# Patient Record
Sex: Female | Born: 1940 | Race: White | Hispanic: No | Marital: Married | Smoking: Former smoker
Health system: Southern US, Community
[De-identification: ages and names within clinical notes are randomized; demographics above are authoritative.]

## PROBLEM LIST (undated history)

## (undated) DIAGNOSIS — I10 Essential (primary) hypertension: Secondary | ICD-10-CM

## (undated) DIAGNOSIS — Z8489 Family history of other specified conditions: Secondary | ICD-10-CM

## (undated) DIAGNOSIS — Z972 Presence of dental prosthetic device (complete) (partial): Secondary | ICD-10-CM

## (undated) DIAGNOSIS — I209 Angina pectoris, unspecified: Secondary | ICD-10-CM

## (undated) DIAGNOSIS — I340 Nonrheumatic mitral (valve) insufficiency: Secondary | ICD-10-CM

## (undated) DIAGNOSIS — I5189 Other ill-defined heart diseases: Secondary | ICD-10-CM

## (undated) HISTORY — PX: ABDOMINAL HYSTERECTOMY: SHX81

---

## 2003-10-02 DIAGNOSIS — L409 Psoriasis, unspecified: Secondary | ICD-10-CM

## 2003-10-02 HISTORY — DX: Psoriasis, unspecified: L40.9

## 2003-10-02 HISTORY — PX: REPLACEMENT TOTAL KNEE BILATERAL: SUR1225

## 2004-08-21 ENCOUNTER — Ambulatory Visit: Payer: Self-pay | Admitting: Internal Medicine

## 2005-05-21 ENCOUNTER — Ambulatory Visit: Payer: Self-pay | Admitting: Gastroenterology

## 2006-03-07 ENCOUNTER — Ambulatory Visit: Payer: Self-pay | Admitting: Internal Medicine

## 2007-03-10 ENCOUNTER — Ambulatory Visit: Payer: Self-pay | Admitting: Internal Medicine

## 2008-06-03 ENCOUNTER — Ambulatory Visit: Payer: Self-pay | Admitting: Internal Medicine

## 2008-06-21 ENCOUNTER — Ambulatory Visit: Payer: Self-pay | Admitting: Unknown Physician Specialty

## 2008-12-28 ENCOUNTER — Ambulatory Visit: Payer: Self-pay | Admitting: Internal Medicine

## 2009-06-15 ENCOUNTER — Ambulatory Visit: Payer: Self-pay | Admitting: Internal Medicine

## 2010-06-16 ENCOUNTER — Ambulatory Visit: Payer: Self-pay | Admitting: Internal Medicine

## 2011-08-21 ENCOUNTER — Ambulatory Visit: Payer: Self-pay | Admitting: Internal Medicine

## 2012-09-15 ENCOUNTER — Ambulatory Visit: Payer: Self-pay | Admitting: Internal Medicine

## 2013-09-10 ENCOUNTER — Ambulatory Visit: Payer: Self-pay | Admitting: Gastroenterology

## 2013-09-16 ENCOUNTER — Ambulatory Visit: Payer: Self-pay | Admitting: Internal Medicine

## 2014-09-17 ENCOUNTER — Ambulatory Visit: Payer: Self-pay | Admitting: Internal Medicine

## 2014-10-06 DIAGNOSIS — R102 Pelvic and perineal pain: Secondary | ICD-10-CM | POA: Diagnosis not present

## 2014-12-14 DIAGNOSIS — E1159 Type 2 diabetes mellitus with other circulatory complications: Secondary | ICD-10-CM | POA: Diagnosis not present

## 2014-12-14 DIAGNOSIS — E039 Hypothyroidism, unspecified: Secondary | ICD-10-CM | POA: Diagnosis not present

## 2014-12-14 DIAGNOSIS — E782 Mixed hyperlipidemia: Secondary | ICD-10-CM | POA: Diagnosis not present

## 2014-12-21 DIAGNOSIS — I10 Essential (primary) hypertension: Secondary | ICD-10-CM | POA: Diagnosis not present

## 2014-12-21 DIAGNOSIS — E1159 Type 2 diabetes mellitus with other circulatory complications: Secondary | ICD-10-CM | POA: Diagnosis not present

## 2015-04-19 DIAGNOSIS — I10 Essential (primary) hypertension: Secondary | ICD-10-CM | POA: Diagnosis not present

## 2015-04-19 DIAGNOSIS — Z96653 Presence of artificial knee joint, bilateral: Secondary | ICD-10-CM | POA: Diagnosis not present

## 2015-04-19 DIAGNOSIS — E1151 Type 2 diabetes mellitus with diabetic peripheral angiopathy without gangrene: Secondary | ICD-10-CM | POA: Diagnosis not present

## 2015-04-26 DIAGNOSIS — E782 Mixed hyperlipidemia: Secondary | ICD-10-CM | POA: Diagnosis not present

## 2015-04-26 DIAGNOSIS — E1151 Type 2 diabetes mellitus with diabetic peripheral angiopathy without gangrene: Secondary | ICD-10-CM | POA: Diagnosis not present

## 2015-04-26 DIAGNOSIS — E039 Hypothyroidism, unspecified: Secondary | ICD-10-CM | POA: Diagnosis not present

## 2015-05-05 DIAGNOSIS — E119 Type 2 diabetes mellitus without complications: Secondary | ICD-10-CM | POA: Diagnosis not present

## 2015-09-20 DIAGNOSIS — E039 Hypothyroidism, unspecified: Secondary | ICD-10-CM | POA: Diagnosis not present

## 2015-09-20 DIAGNOSIS — E782 Mixed hyperlipidemia: Secondary | ICD-10-CM | POA: Diagnosis not present

## 2015-09-20 DIAGNOSIS — E1151 Type 2 diabetes mellitus with diabetic peripheral angiopathy without gangrene: Secondary | ICD-10-CM | POA: Diagnosis not present

## 2015-09-27 ENCOUNTER — Other Ambulatory Visit: Payer: Self-pay | Admitting: Internal Medicine

## 2015-09-27 DIAGNOSIS — E039 Hypothyroidism, unspecified: Secondary | ICD-10-CM | POA: Diagnosis not present

## 2015-09-27 DIAGNOSIS — Z1231 Encounter for screening mammogram for malignant neoplasm of breast: Secondary | ICD-10-CM

## 2015-09-27 DIAGNOSIS — E1151 Type 2 diabetes mellitus with diabetic peripheral angiopathy without gangrene: Secondary | ICD-10-CM | POA: Diagnosis not present

## 2015-09-27 DIAGNOSIS — Z0000 Encounter for general adult medical examination without abnormal findings: Secondary | ICD-10-CM | POA: Diagnosis not present

## 2015-09-29 ENCOUNTER — Ambulatory Visit
Admission: RE | Admit: 2015-09-29 | Discharge: 2015-09-29 | Disposition: A | Discharge status: Home / Self Care | Payer: Commercial Managed Care - HMO | Source: Ambulatory Visit | Attending: Internal Medicine | Admitting: Internal Medicine

## 2015-09-29 ENCOUNTER — Other Ambulatory Visit: Payer: Self-pay | Admitting: Internal Medicine

## 2015-09-29 DIAGNOSIS — Z1231 Encounter for screening mammogram for malignant neoplasm of breast: Secondary | ICD-10-CM | POA: Insufficient documentation

## 2016-08-17 ENCOUNTER — Other Ambulatory Visit: Payer: Self-pay | Admitting: Internal Medicine

## 2016-08-17 DIAGNOSIS — Z1231 Encounter for screening mammogram for malignant neoplasm of breast: Secondary | ICD-10-CM

## 2016-09-27 DIAGNOSIS — E1151 Type 2 diabetes mellitus with diabetic peripheral angiopathy without gangrene: Secondary | ICD-10-CM | POA: Diagnosis not present

## 2016-10-03 ENCOUNTER — Ambulatory Visit
Admission: RE | Admit: 2016-10-03 | Discharge: 2016-10-03 | Disposition: A | Discharge status: Home / Self Care | Payer: Commercial Managed Care - HMO | Source: Ambulatory Visit | Attending: Internal Medicine | Admitting: Internal Medicine

## 2016-10-03 DIAGNOSIS — Z1231 Encounter for screening mammogram for malignant neoplasm of breast: Secondary | ICD-10-CM | POA: Insufficient documentation

## 2016-10-04 DIAGNOSIS — E1151 Type 2 diabetes mellitus with diabetic peripheral angiopathy without gangrene: Secondary | ICD-10-CM | POA: Diagnosis not present

## 2016-10-04 DIAGNOSIS — Z0000 Encounter for general adult medical examination without abnormal findings: Secondary | ICD-10-CM | POA: Diagnosis not present

## 2016-11-12 DIAGNOSIS — R079 Chest pain, unspecified: Secondary | ICD-10-CM | POA: Diagnosis not present

## 2016-11-12 DIAGNOSIS — E860 Dehydration: Secondary | ICD-10-CM | POA: Diagnosis not present

## 2016-11-12 DIAGNOSIS — B349 Viral infection, unspecified: Secondary | ICD-10-CM | POA: Diagnosis not present

## 2017-03-28 DIAGNOSIS — E782 Mixed hyperlipidemia: Secondary | ICD-10-CM | POA: Diagnosis not present

## 2017-03-28 DIAGNOSIS — E1151 Type 2 diabetes mellitus with diabetic peripheral angiopathy without gangrene: Secondary | ICD-10-CM | POA: Diagnosis not present

## 2017-03-28 DIAGNOSIS — Z0000 Encounter for general adult medical examination without abnormal findings: Secondary | ICD-10-CM | POA: Diagnosis not present

## 2017-04-04 DIAGNOSIS — Z79899 Other long term (current) drug therapy: Secondary | ICD-10-CM | POA: Diagnosis not present

## 2017-04-04 DIAGNOSIS — Z0000 Encounter for general adult medical examination without abnormal findings: Secondary | ICD-10-CM | POA: Diagnosis not present

## 2017-04-04 DIAGNOSIS — E039 Hypothyroidism, unspecified: Secondary | ICD-10-CM | POA: Diagnosis not present

## 2017-04-04 DIAGNOSIS — E782 Mixed hyperlipidemia: Secondary | ICD-10-CM | POA: Diagnosis not present

## 2017-04-04 DIAGNOSIS — E1151 Type 2 diabetes mellitus with diabetic peripheral angiopathy without gangrene: Secondary | ICD-10-CM | POA: Diagnosis not present

## 2017-05-16 DIAGNOSIS — Z96652 Presence of left artificial knee joint: Secondary | ICD-10-CM | POA: Diagnosis not present

## 2017-05-16 DIAGNOSIS — M1711 Unilateral primary osteoarthritis, right knee: Secondary | ICD-10-CM | POA: Diagnosis not present

## 2017-05-16 DIAGNOSIS — M1712 Unilateral primary osteoarthritis, left knee: Secondary | ICD-10-CM | POA: Diagnosis not present

## 2017-05-16 DIAGNOSIS — Z96651 Presence of right artificial knee joint: Secondary | ICD-10-CM | POA: Diagnosis not present

## 2017-10-02 DIAGNOSIS — E782 Mixed hyperlipidemia: Secondary | ICD-10-CM | POA: Diagnosis not present

## 2017-10-02 DIAGNOSIS — E1151 Type 2 diabetes mellitus with diabetic peripheral angiopathy without gangrene: Secondary | ICD-10-CM | POA: Diagnosis not present

## 2017-10-02 DIAGNOSIS — E039 Hypothyroidism, unspecified: Secondary | ICD-10-CM | POA: Diagnosis not present

## 2017-10-09 DIAGNOSIS — E1151 Type 2 diabetes mellitus with diabetic peripheral angiopathy without gangrene: Secondary | ICD-10-CM | POA: Diagnosis not present

## 2017-10-09 DIAGNOSIS — Z0000 Encounter for general adult medical examination without abnormal findings: Secondary | ICD-10-CM | POA: Diagnosis not present

## 2017-10-09 DIAGNOSIS — E039 Hypothyroidism, unspecified: Secondary | ICD-10-CM | POA: Diagnosis not present

## 2017-11-12 ENCOUNTER — Other Ambulatory Visit: Payer: Self-pay | Admitting: Internal Medicine

## 2017-11-12 DIAGNOSIS — Z1231 Encounter for screening mammogram for malignant neoplasm of breast: Secondary | ICD-10-CM

## 2017-11-26 ENCOUNTER — Ambulatory Visit
Admission: RE | Admit: 2017-11-26 | Discharge: 2017-11-26 | Disposition: A | Discharge status: Home / Self Care | Payer: Medicare HMO | Source: Ambulatory Visit | Attending: Internal Medicine | Admitting: Internal Medicine

## 2017-11-26 DIAGNOSIS — Z1231 Encounter for screening mammogram for malignant neoplasm of breast: Secondary | ICD-10-CM | POA: Diagnosis not present

## 2018-01-31 DIAGNOSIS — E119 Type 2 diabetes mellitus without complications: Secondary | ICD-10-CM | POA: Diagnosis not present

## 2018-04-08 DIAGNOSIS — E1151 Type 2 diabetes mellitus with diabetic peripheral angiopathy without gangrene: Secondary | ICD-10-CM | POA: Diagnosis not present

## 2018-04-08 DIAGNOSIS — E039 Hypothyroidism, unspecified: Secondary | ICD-10-CM | POA: Diagnosis not present

## 2018-04-15 DIAGNOSIS — E039 Hypothyroidism, unspecified: Secondary | ICD-10-CM | POA: Diagnosis not present

## 2018-04-15 DIAGNOSIS — E782 Mixed hyperlipidemia: Secondary | ICD-10-CM | POA: Diagnosis not present

## 2018-04-15 DIAGNOSIS — E1151 Type 2 diabetes mellitus with diabetic peripheral angiopathy without gangrene: Secondary | ICD-10-CM | POA: Diagnosis not present

## 2018-06-23 DIAGNOSIS — Z111 Encounter for screening for respiratory tuberculosis: Secondary | ICD-10-CM | POA: Diagnosis not present

## 2018-06-26 DIAGNOSIS — Z23 Encounter for immunization: Secondary | ICD-10-CM | POA: Diagnosis not present

## 2018-10-08 DIAGNOSIS — E1151 Type 2 diabetes mellitus with diabetic peripheral angiopathy without gangrene: Secondary | ICD-10-CM | POA: Diagnosis not present

## 2018-10-08 DIAGNOSIS — E039 Hypothyroidism, unspecified: Secondary | ICD-10-CM | POA: Diagnosis not present

## 2018-10-08 DIAGNOSIS — E782 Mixed hyperlipidemia: Secondary | ICD-10-CM | POA: Diagnosis not present

## 2018-10-14 DIAGNOSIS — E039 Hypothyroidism, unspecified: Secondary | ICD-10-CM | POA: Diagnosis not present

## 2018-10-14 DIAGNOSIS — Z0000 Encounter for general adult medical examination without abnormal findings: Secondary | ICD-10-CM | POA: Diagnosis not present

## 2018-10-14 DIAGNOSIS — E1151 Type 2 diabetes mellitus with diabetic peripheral angiopathy without gangrene: Secondary | ICD-10-CM | POA: Diagnosis not present

## 2018-10-24 DIAGNOSIS — E039 Hypothyroidism, unspecified: Secondary | ICD-10-CM | POA: Diagnosis not present

## 2018-10-24 DIAGNOSIS — Z86010 Personal history of colonic polyps: Secondary | ICD-10-CM | POA: Diagnosis not present

## 2018-10-24 DIAGNOSIS — I10 Essential (primary) hypertension: Secondary | ICD-10-CM | POA: Diagnosis not present

## 2018-10-24 DIAGNOSIS — E782 Mixed hyperlipidemia: Secondary | ICD-10-CM | POA: Diagnosis not present

## 2018-10-24 DIAGNOSIS — E1151 Type 2 diabetes mellitus with diabetic peripheral angiopathy without gangrene: Secondary | ICD-10-CM | POA: Diagnosis not present

## 2018-11-24 DIAGNOSIS — K5730 Diverticulosis of large intestine without perforation or abscess without bleeding: Secondary | ICD-10-CM | POA: Diagnosis not present

## 2018-11-24 DIAGNOSIS — K640 First degree hemorrhoids: Secondary | ICD-10-CM | POA: Diagnosis not present

## 2018-11-24 DIAGNOSIS — Z86010 Personal history of colonic polyps: Secondary | ICD-10-CM | POA: Diagnosis not present

## 2019-03-09 DIAGNOSIS — H2511 Age-related nuclear cataract, right eye: Secondary | ICD-10-CM | POA: Diagnosis not present

## 2019-04-08 DIAGNOSIS — E1151 Type 2 diabetes mellitus with diabetic peripheral angiopathy without gangrene: Secondary | ICD-10-CM | POA: Diagnosis not present

## 2019-04-08 DIAGNOSIS — E039 Hypothyroidism, unspecified: Secondary | ICD-10-CM | POA: Diagnosis not present

## 2019-04-15 DIAGNOSIS — E1165 Type 2 diabetes mellitus with hyperglycemia: Secondary | ICD-10-CM | POA: Diagnosis not present

## 2019-07-09 DIAGNOSIS — E1165 Type 2 diabetes mellitus with hyperglycemia: Secondary | ICD-10-CM | POA: Diagnosis not present

## 2019-07-16 DIAGNOSIS — E1165 Type 2 diabetes mellitus with hyperglycemia: Secondary | ICD-10-CM | POA: Diagnosis not present

## 2019-07-16 DIAGNOSIS — E785 Hyperlipidemia, unspecified: Secondary | ICD-10-CM | POA: Diagnosis not present

## 2019-07-16 DIAGNOSIS — I10 Essential (primary) hypertension: Secondary | ICD-10-CM | POA: Diagnosis not present

## 2019-07-16 DIAGNOSIS — Z87891 Personal history of nicotine dependence: Secondary | ICD-10-CM | POA: Diagnosis not present

## 2019-10-22 DIAGNOSIS — E1151 Type 2 diabetes mellitus with diabetic peripheral angiopathy without gangrene: Secondary | ICD-10-CM | POA: Diagnosis not present

## 2019-10-30 DIAGNOSIS — E119 Type 2 diabetes mellitus without complications: Secondary | ICD-10-CM | POA: Diagnosis not present

## 2019-10-30 DIAGNOSIS — Z0000 Encounter for general adult medical examination without abnormal findings: Secondary | ICD-10-CM | POA: Diagnosis not present

## 2019-10-30 DIAGNOSIS — Z87891 Personal history of nicotine dependence: Secondary | ICD-10-CM | POA: Diagnosis not present

## 2019-10-30 DIAGNOSIS — I10 Essential (primary) hypertension: Secondary | ICD-10-CM | POA: Diagnosis not present

## 2019-10-30 DIAGNOSIS — E785 Hyperlipidemia, unspecified: Secondary | ICD-10-CM | POA: Diagnosis not present

## 2020-01-28 DIAGNOSIS — E1165 Type 2 diabetes mellitus with hyperglycemia: Secondary | ICD-10-CM | POA: Diagnosis not present

## 2020-01-29 DIAGNOSIS — Z87891 Personal history of nicotine dependence: Secondary | ICD-10-CM | POA: Diagnosis not present

## 2020-01-29 DIAGNOSIS — Z789 Other specified health status: Secondary | ICD-10-CM | POA: Diagnosis not present

## 2020-01-29 DIAGNOSIS — E119 Type 2 diabetes mellitus without complications: Secondary | ICD-10-CM | POA: Diagnosis not present

## 2020-01-29 DIAGNOSIS — I10 Essential (primary) hypertension: Secondary | ICD-10-CM | POA: Diagnosis not present

## 2020-01-29 DIAGNOSIS — Z7984 Long term (current) use of oral hypoglycemic drugs: Secondary | ICD-10-CM | POA: Diagnosis not present

## 2020-02-01 DIAGNOSIS — Z03818 Encounter for observation for suspected exposure to other biological agents ruled out: Secondary | ICD-10-CM | POA: Diagnosis not present

## 2020-03-11 DIAGNOSIS — E119 Type 2 diabetes mellitus without complications: Secondary | ICD-10-CM | POA: Diagnosis not present

## 2020-04-07 DIAGNOSIS — M25562 Pain in left knee: Secondary | ICD-10-CM | POA: Diagnosis not present

## 2020-04-07 DIAGNOSIS — Z96653 Presence of artificial knee joint, bilateral: Secondary | ICD-10-CM | POA: Diagnosis not present

## 2020-04-07 DIAGNOSIS — M25561 Pain in right knee: Secondary | ICD-10-CM | POA: Diagnosis not present

## 2020-04-18 ENCOUNTER — Other Ambulatory Visit: Payer: Self-pay | Admitting: Internal Medicine

## 2020-04-18 DIAGNOSIS — Z1231 Encounter for screening mammogram for malignant neoplasm of breast: Secondary | ICD-10-CM

## 2020-04-19 ENCOUNTER — Ambulatory Visit
Admission: RE | Admit: 2020-04-19 | Discharge: 2020-04-19 | Disposition: A | Discharge status: Home / Self Care | Payer: Medicare HMO | Source: Ambulatory Visit | Attending: Internal Medicine | Admitting: Internal Medicine

## 2020-04-19 DIAGNOSIS — Z1231 Encounter for screening mammogram for malignant neoplasm of breast: Secondary | ICD-10-CM

## 2020-04-21 DIAGNOSIS — E1151 Type 2 diabetes mellitus with diabetic peripheral angiopathy without gangrene: Secondary | ICD-10-CM | POA: Diagnosis not present

## 2020-04-21 DIAGNOSIS — E1165 Type 2 diabetes mellitus with hyperglycemia: Secondary | ICD-10-CM | POA: Diagnosis not present

## 2020-04-28 DIAGNOSIS — E119 Type 2 diabetes mellitus without complications: Secondary | ICD-10-CM | POA: Diagnosis not present

## 2020-08-01 ENCOUNTER — Other Ambulatory Visit: Payer: Self-pay | Admitting: Cardiology

## 2020-08-01 ENCOUNTER — Other Ambulatory Visit: Payer: Self-pay | Admitting: Gastroenterology

## 2020-08-01 DIAGNOSIS — R079 Chest pain, unspecified: Secondary | ICD-10-CM

## 2020-08-01 DIAGNOSIS — R0602 Shortness of breath: Secondary | ICD-10-CM

## 2020-08-01 DIAGNOSIS — I10 Essential (primary) hypertension: Secondary | ICD-10-CM | POA: Diagnosis not present

## 2020-08-01 DIAGNOSIS — E119 Type 2 diabetes mellitus without complications: Secondary | ICD-10-CM | POA: Diagnosis not present

## 2020-08-01 DIAGNOSIS — R0600 Dyspnea, unspecified: Secondary | ICD-10-CM | POA: Diagnosis not present

## 2020-08-01 DIAGNOSIS — I209 Angina pectoris, unspecified: Secondary | ICD-10-CM | POA: Diagnosis not present

## 2020-08-01 DIAGNOSIS — E782 Mixed hyperlipidemia: Secondary | ICD-10-CM | POA: Diagnosis not present

## 2020-08-01 DIAGNOSIS — R0789 Other chest pain: Secondary | ICD-10-CM | POA: Diagnosis not present

## 2020-08-02 ENCOUNTER — Other Ambulatory Visit: Payer: Self-pay | Admitting: Physician Assistant

## 2020-08-02 DIAGNOSIS — I10 Essential (primary) hypertension: Secondary | ICD-10-CM

## 2020-08-02 DIAGNOSIS — E785 Hyperlipidemia, unspecified: Secondary | ICD-10-CM

## 2020-08-02 DIAGNOSIS — R0602 Shortness of breath: Secondary | ICD-10-CM

## 2020-08-12 ENCOUNTER — Ambulatory Visit
Admission: RE | Admit: 2020-08-12 | Discharge: 2020-08-12 | Disposition: A | Discharge status: Home / Self Care | Payer: Medicare HMO | Source: Ambulatory Visit | Attending: Physician Assistant | Admitting: Physician Assistant

## 2020-08-12 ENCOUNTER — Encounter
Admission: RE | Admit: 2020-08-12 | Discharge: 2020-08-12 | Disposition: A | Discharge status: Home / Self Care | Payer: Medicare HMO | Source: Ambulatory Visit | Attending: Cardiology | Admitting: Cardiology

## 2020-08-12 ENCOUNTER — Other Ambulatory Visit: Payer: Self-pay

## 2020-08-12 DIAGNOSIS — R0602 Shortness of breath: Secondary | ICD-10-CM | POA: Insufficient documentation

## 2020-08-12 DIAGNOSIS — R0789 Other chest pain: Secondary | ICD-10-CM | POA: Diagnosis not present

## 2020-08-12 DIAGNOSIS — R079 Chest pain, unspecified: Secondary | ICD-10-CM

## 2020-08-12 DIAGNOSIS — E785 Hyperlipidemia, unspecified: Secondary | ICD-10-CM | POA: Insufficient documentation

## 2020-08-12 DIAGNOSIS — I10 Essential (primary) hypertension: Secondary | ICD-10-CM | POA: Insufficient documentation

## 2020-08-12 DIAGNOSIS — R0600 Dyspnea, unspecified: Secondary | ICD-10-CM | POA: Diagnosis not present

## 2020-08-12 MED ORDER — REGADENOSON 0.4 MG/5ML IV SOLN
0.4000 mg | Freq: Once | INTRAVENOUS | Status: AC
  Administered 2020-08-12: 0.4 mg via INTRAVENOUS

## 2020-08-12 MED ORDER — TECHNETIUM TC 99M TETROFOSMIN IV KIT
32.0300 | PACK | Freq: Once | INTRAVENOUS | Status: AC | PRN
  Administered 2020-08-12: 32.03 via INTRAVENOUS

## 2020-08-12 MED ORDER — TECHNETIUM TC 99M TETROFOSMIN IV KIT
10.0000 | PACK | Freq: Once | INTRAVENOUS | Status: AC | PRN
  Administered 2020-08-12: 10.04 via INTRAVENOUS

## 2020-08-12 NOTE — Progress Notes (Signed)
*  PRELIMINARY RESULTS* Echocardiogram 2D Echocardiogram has been performed.  Cristela Blue 08/12/2020, 12:06 PM

## 2020-08-13 LAB — NM MYOCAR MULTI W/SPECT W/WALL MOTION / EF
Estimated workload: 1 METS
Exercise duration (sec): 53 s
LV dias vol: 45 mL (ref 46–106)
LV sys vol: 14 mL
MPHR: 143 {beats}/min
Peak HR: 107 {beats}/min
Percent HR: 74 %
Rest HR: 65 {beats}/min
SDS: 16
SRS: 0
SSS: 13
TID: 1.1

## 2020-08-13 LAB — ECHOCARDIOGRAM COMPLETE
AR max vel: 2.33 cm2
AV Area VTI: 2.52 cm2
AV Area mean vel: 2.23 cm2
AV Mean grad: 5 mmHg
AV Peak grad: 8.2 mmHg
Ao pk vel: 1.43 m/s
Area-P 1/2: 3.12 cm2
S' Lateral: 2.18 cm

## 2020-08-17 DIAGNOSIS — Z8249 Family history of ischemic heart disease and other diseases of the circulatory system: Secondary | ICD-10-CM | POA: Diagnosis not present

## 2020-08-17 DIAGNOSIS — R0602 Shortness of breath: Secondary | ICD-10-CM | POA: Diagnosis not present

## 2020-08-17 DIAGNOSIS — E1151 Type 2 diabetes mellitus with diabetic peripheral angiopathy without gangrene: Secondary | ICD-10-CM | POA: Diagnosis not present

## 2020-08-17 DIAGNOSIS — R6889 Other general symptoms and signs: Secondary | ICD-10-CM | POA: Diagnosis not present

## 2020-08-17 DIAGNOSIS — Z01818 Encounter for other preprocedural examination: Secondary | ICD-10-CM | POA: Diagnosis not present

## 2020-08-17 DIAGNOSIS — E782 Mixed hyperlipidemia: Secondary | ICD-10-CM | POA: Diagnosis not present

## 2020-08-17 DIAGNOSIS — I10 Essential (primary) hypertension: Secondary | ICD-10-CM | POA: Diagnosis not present

## 2020-08-17 DIAGNOSIS — R079 Chest pain, unspecified: Secondary | ICD-10-CM | POA: Diagnosis not present

## 2020-08-17 DIAGNOSIS — R9439 Abnormal result of other cardiovascular function study: Secondary | ICD-10-CM | POA: Diagnosis not present

## 2020-08-30 ENCOUNTER — Other Ambulatory Visit
Admission: RE | Admit: 2020-08-30 | Discharge: 2020-08-30 | Disposition: A | Discharge status: Home / Self Care | Payer: Medicare HMO | Source: Ambulatory Visit | Attending: Physician Assistant | Admitting: Physician Assistant

## 2020-08-30 ENCOUNTER — Other Ambulatory Visit: Payer: Self-pay

## 2020-08-30 DIAGNOSIS — Z01812 Encounter for preprocedural laboratory examination: Secondary | ICD-10-CM | POA: Diagnosis not present

## 2020-08-30 DIAGNOSIS — Z20822 Contact with and (suspected) exposure to covid-19: Secondary | ICD-10-CM | POA: Insufficient documentation

## 2020-08-30 LAB — SARS CORONAVIRUS 2 (TAT 6-24 HRS): SARS Coronavirus 2: NEGATIVE

## 2020-08-31 ENCOUNTER — Encounter: Payer: Self-pay | Admitting: Cardiology

## 2020-08-31 ENCOUNTER — Other Ambulatory Visit: Payer: Self-pay

## 2020-08-31 ENCOUNTER — Encounter
Admission: RE | Disposition: A | Discharge status: Home / Self Care | Payer: Self-pay | Source: Home / Self Care | Attending: Cardiology

## 2020-08-31 ENCOUNTER — Observation Stay
Admission: RE | Admit: 2020-08-31 | Discharge: 2020-09-01 | Disposition: A | Discharge status: Home / Self Care | Payer: Medicare HMO | Attending: Cardiology | Admitting: Cardiology

## 2020-08-31 DIAGNOSIS — I2510 Atherosclerotic heart disease of native coronary artery without angina pectoris: Secondary | ICD-10-CM | POA: Diagnosis present

## 2020-08-31 DIAGNOSIS — R0602 Shortness of breath: Secondary | ICD-10-CM

## 2020-08-31 DIAGNOSIS — R9439 Abnormal result of other cardiovascular function study: Secondary | ICD-10-CM | POA: Insufficient documentation

## 2020-08-31 DIAGNOSIS — R079 Chest pain, unspecified: Secondary | ICD-10-CM | POA: Diagnosis not present

## 2020-08-31 DIAGNOSIS — I25119 Atherosclerotic heart disease of native coronary artery with unspecified angina pectoris: Secondary | ICD-10-CM | POA: Diagnosis not present

## 2020-08-31 DIAGNOSIS — Z9861 Coronary angioplasty status: Secondary | ICD-10-CM | POA: Diagnosis not present

## 2020-08-31 DIAGNOSIS — R9430 Abnormal result of cardiovascular function study, unspecified: Secondary | ICD-10-CM

## 2020-08-31 HISTORY — PX: LEFT HEART CATH AND CORONARY ANGIOGRAPHY: CATH118249

## 2020-08-31 HISTORY — PX: CORONARY STENT INTERVENTION: CATH118234

## 2020-08-31 HISTORY — DX: Essential (primary) hypertension: I10

## 2020-08-31 HISTORY — DX: Angina pectoris, unspecified: I20.9

## 2020-08-31 LAB — GLUCOSE, CAPILLARY
Glucose-Capillary: 166 mg/dL — ABNORMAL HIGH (ref 70–99)
Glucose-Capillary: 157 mg/dL — ABNORMAL HIGH (ref 70–99)
Glucose-Capillary: 180 mg/dL — ABNORMAL HIGH (ref 70–99)
Glucose-Capillary: 166 mg/dL — ABNORMAL HIGH (ref 70–99)

## 2020-08-31 LAB — HEMOGLOBIN A1C
Hgb A1c MFr Bld: 8 % — ABNORMAL HIGH (ref 4.8–5.6)
Mean Plasma Glucose: 182.9 mg/dL

## 2020-08-31 SURGERY — LEFT HEART CATH AND CORONARY ANGIOGRAPHY
Anesthesia: Moderate Sedation

## 2020-08-31 MED ORDER — HEPARIN SODIUM (PORCINE) 1000 UNIT/ML IJ SOLN
INTRAMUSCULAR | Status: DC | PRN
  Administered 2020-08-31: 7000 [IU] via INTRAVENOUS
  Administered 2020-08-31: 3500 [IU] via INTRAVENOUS

## 2020-08-31 MED ORDER — SODIUM CHLORIDE 0.9 % WEIGHT BASED INFUSION: 1.0000 mL/kg/h | INTRAVENOUS | Status: DC

## 2020-08-31 MED ORDER — FENTANYL CITRATE (PF) 100 MCG/2ML IJ SOLN
INTRAMUSCULAR | Status: DC | PRN
  Administered 2020-08-31 (×2): 25 ug via INTRAVENOUS

## 2020-08-31 MED ORDER — LEVOTHYROXINE SODIUM 100 MCG PO TABS
100.0000 ug | ORAL_TABLET | Freq: Every day | ORAL | Status: DC
  Administered 2020-09-01: 100 ug via ORAL
  Filled 2020-08-31: qty 1

## 2020-08-31 MED ORDER — SODIUM CHLORIDE 0.9% FLUSH: 3.0000 mL | INTRAVENOUS | Status: DC | PRN

## 2020-08-31 MED ORDER — SODIUM CHLORIDE 0.9 % WEIGHT BASED INFUSION
1.0000 mL/kg/h | INTRAVENOUS | Status: AC
  Administered 2020-08-31: 1 mL/kg/h via INTRAVENOUS

## 2020-08-31 MED ORDER — LIDOCAINE HCL (PF) 1 % IJ SOLN
INTRAMUSCULAR | Status: AC
  Filled 2020-08-31: qty 30

## 2020-08-31 MED ORDER — CLOPIDOGREL BISULFATE 300 MG PO TABS
ORAL_TABLET | ORAL | Status: AC
  Filled 2020-08-31: qty 2

## 2020-08-31 MED ORDER — GLIMEPIRIDE 2 MG PO TABS
2.0000 mg | ORAL_TABLET | Freq: Every day | ORAL | Status: DC
  Administered 2020-09-01: 2 mg via ORAL
  Filled 2020-08-31: qty 1

## 2020-08-31 MED ORDER — HEPARIN (PORCINE) IN NACL 1000-0.9 UT/500ML-% IV SOLN
INTRAVENOUS | Status: AC
  Filled 2020-08-31: qty 1000

## 2020-08-31 MED ORDER — ASPIRIN 81 MG PO CHEW
81.0000 mg | CHEWABLE_TABLET | Freq: Every day | ORAL | Status: DC
  Administered 2020-09-01: 81 mg via ORAL
  Filled 2020-08-31: qty 1

## 2020-08-31 MED ORDER — CLOPIDOGREL BISULFATE 75 MG PO TABS
75.0000 mg | ORAL_TABLET | Freq: Every day | ORAL | Status: DC
  Administered 2020-09-01: 75 mg via ORAL
  Filled 2020-08-31: qty 1

## 2020-08-31 MED ORDER — INSULIN ASPART 100 UNIT/ML ~~LOC~~ SOLN
0.0000 [IU] | Freq: Three times a day (TID) | SUBCUTANEOUS | Status: DC
  Administered 2020-08-31: 3 [IU] via SUBCUTANEOUS
  Filled 2020-08-31 (×2): qty 1

## 2020-08-31 MED ORDER — TRIAMTERENE-HCTZ 37.5-25 MG PO TABS
1.0000 | ORAL_TABLET | Freq: Every day | ORAL | Status: DC
  Administered 2020-08-31 – 2020-09-01 (×2): 1 via ORAL
  Filled 2020-08-31 (×2): qty 1

## 2020-08-31 MED ORDER — FENTANYL CITRATE (PF) 100 MCG/2ML IJ SOLN
INTRAMUSCULAR | Status: AC
  Filled 2020-08-31: qty 2

## 2020-08-31 MED ORDER — AMLODIPINE BESYLATE 5 MG PO TABS
2.5000 mg | ORAL_TABLET | Freq: Every day | ORAL | Status: DC
  Administered 2020-08-31 – 2020-09-01 (×2): 2.5 mg via ORAL
  Filled 2020-08-31 (×2): qty 1

## 2020-08-31 MED ORDER — ASPIRIN 81 MG PO CHEW
CHEWABLE_TABLET | ORAL | Status: DC | PRN
  Administered 2020-08-31: 243 mg via ORAL

## 2020-08-31 MED ORDER — HEPARIN (PORCINE) IN NACL 1000-0.9 UT/500ML-% IV SOLN
INTRAVENOUS | Status: DC | PRN
  Administered 2020-08-31: 500 mL

## 2020-08-31 MED ORDER — HEPARIN SODIUM (PORCINE) 1000 UNIT/ML IJ SOLN
INTRAMUSCULAR | Status: AC
  Filled 2020-08-31: qty 1

## 2020-08-31 MED ORDER — HYDRALAZINE HCL 20 MG/ML IJ SOLN: 10.0000 mg | INTRAMUSCULAR | Status: AC | PRN

## 2020-08-31 MED ORDER — VERAPAMIL HCL 2.5 MG/ML IV SOLN
INTRAVENOUS | Status: AC
  Filled 2020-08-31: qty 2

## 2020-08-31 MED ORDER — CLOPIDOGREL BISULFATE 75 MG PO TABS
ORAL_TABLET | ORAL | Status: DC | PRN
  Administered 2020-08-31: 600 mg via ORAL

## 2020-08-31 MED ORDER — VERAPAMIL HCL 2.5 MG/ML IV SOLN
INTRAVENOUS | Status: DC | PRN
  Administered 2020-08-31: 2.5 mg via INTRA_ARTERIAL

## 2020-08-31 MED ORDER — LABETALOL HCL 5 MG/ML IV SOLN: 10.0000 mg | INTRAVENOUS | Status: AC | PRN

## 2020-08-31 MED ORDER — MIDAZOLAM HCL 2 MG/2ML IJ SOLN
INTRAMUSCULAR | Status: AC
  Filled 2020-08-31: qty 2

## 2020-08-31 MED ORDER — SODIUM CHLORIDE 0.9 % IV SOLN: 250.0000 mL | INTRAVENOUS | Status: DC | PRN

## 2020-08-31 MED ORDER — MIDAZOLAM HCL 2 MG/2ML IJ SOLN
INTRAMUSCULAR | Status: DC | PRN
  Administered 2020-08-31: 1 mg via INTRAVENOUS
  Administered 2020-08-31: 0.5 mg via INTRAVENOUS

## 2020-08-31 MED ORDER — IOHEXOL 300 MG/ML  SOLN
INTRAMUSCULAR | Status: DC | PRN
  Administered 2020-08-31: 200 mL

## 2020-08-31 MED ORDER — SODIUM CHLORIDE 0.9% FLUSH
3.0000 mL | Freq: Two times a day (BID) | INTRAVENOUS | Status: DC
  Administered 2020-08-31 – 2020-09-01 (×2): 3 mL via INTRAVENOUS

## 2020-08-31 MED ORDER — ASPIRIN 81 MG PO CHEW
CHEWABLE_TABLET | ORAL | Status: AC
  Filled 2020-08-31: qty 1

## 2020-08-31 MED ORDER — LIDOCAINE HCL (PF) 1 % IJ SOLN
INTRAMUSCULAR | Status: DC | PRN
  Administered 2020-08-31: 2 mL

## 2020-08-31 MED ORDER — SODIUM CHLORIDE 0.9% FLUSH
3.0000 mL | Freq: Two times a day (BID) | INTRAVENOUS | Status: DC
  Administered 2020-09-01: 3 mL via INTRAVENOUS

## 2020-08-31 MED ORDER — ACETAMINOPHEN 325 MG PO TABS: 650.0000 mg | ORAL_TABLET | ORAL | Status: DC | PRN

## 2020-08-31 MED ORDER — NITROGLYCERIN 1 MG/10 ML FOR IR/CATH LAB
INTRA_ARTERIAL | Status: DC | PRN
  Administered 2020-08-31 (×2): 200 ug via INTRACORONARY

## 2020-08-31 MED ORDER — ASPIRIN 81 MG PO CHEW
81.0000 mg | CHEWABLE_TABLET | ORAL | Status: AC
  Administered 2020-08-31: 81 mg via ORAL

## 2020-08-31 MED ORDER — SIMVASTATIN 20 MG PO TABS
20.0000 mg | ORAL_TABLET | Freq: Every day | ORAL | Status: DC
  Administered 2020-08-31: 20 mg via ORAL
  Filled 2020-08-31: qty 1

## 2020-08-31 MED ORDER — SODIUM CHLORIDE 0.9 % WEIGHT BASED INFUSION: 231.0000 mL/h | INTRAVENOUS | Status: AC

## 2020-08-31 MED ORDER — ONDANSETRON HCL 4 MG/2ML IJ SOLN: 4.0000 mg | Freq: Four times a day (QID) | INTRAMUSCULAR | Status: DC | PRN

## 2020-08-31 MED ORDER — ASPIRIN 81 MG PO CHEW
CHEWABLE_TABLET | ORAL | Status: AC
  Filled 2020-08-31: qty 3

## 2020-08-31 SURGICAL SUPPLY — 16 items
BALLN TREK RX 2.5X20 (BALLOONS) ×4
BALLN ~~LOC~~ EUPHORA RX 3.25X15 (BALLOONS) ×4
BALLOON TREK RX 2.5X20 (BALLOONS) ×2 IMPLANT
BALLOON ~~LOC~~ EUPHORA RX 3.25X15 (BALLOONS) ×2 IMPLANT
CATH 5F 110X4 TIG (CATHETERS) ×4 IMPLANT
CATH LAUNCHER 6FR JL3.5 (CATHETERS) ×4 IMPLANT
CATH VISTA GUIDE 6FR XB3.5 (CATHETERS) ×4 IMPLANT
DEVICE RAD TR BAND REGULAR (VASCULAR PRODUCTS) ×4 IMPLANT
GLIDESHEATH SLEND SS 6F .021 (SHEATH) ×4 IMPLANT
GUIDEWIRE INQWIRE 1.5J.035X260 (WIRE) ×2 IMPLANT
INQWIRE 1.5J .035X260CM (WIRE) ×4
KIT ENCORE 26 ADVANTAGE (KITS) ×4 IMPLANT
KIT MANI 3VAL PERCEP (MISCELLANEOUS) ×4 IMPLANT
PACK CARDIAC CATH (CUSTOM PROCEDURE TRAY) ×4 IMPLANT
STENT RESOLUTE ONYX 3.0X22 (Permanent Stent) ×4 IMPLANT
WIRE ASAHI PROWATER 180CM (WIRE) ×4 IMPLANT

## 2020-09-01 ENCOUNTER — Encounter: Payer: Self-pay | Admitting: Cardiology

## 2020-09-01 DIAGNOSIS — R9439 Abnormal result of other cardiovascular function study: Secondary | ICD-10-CM | POA: Diagnosis not present

## 2020-09-01 DIAGNOSIS — R079 Chest pain, unspecified: Secondary | ICD-10-CM | POA: Diagnosis not present

## 2020-09-01 DIAGNOSIS — I25119 Atherosclerotic heart disease of native coronary artery with unspecified angina pectoris: Secondary | ICD-10-CM | POA: Diagnosis not present

## 2020-09-01 DIAGNOSIS — I2510 Atherosclerotic heart disease of native coronary artery without angina pectoris: Secondary | ICD-10-CM | POA: Diagnosis not present

## 2020-09-01 DIAGNOSIS — Z9861 Coronary angioplasty status: Secondary | ICD-10-CM | POA: Diagnosis not present

## 2020-09-01 LAB — BASIC METABOLIC PANEL
Anion gap: 8 (ref 5–15)
BUN: 21 mg/dL (ref 8–23)
CO2: 25 mmol/L (ref 22–32)
Calcium: 9.1 mg/dL (ref 8.9–10.3)
Chloride: 102 mmol/L (ref 98–111)
Creatinine, Ser: 0.86 mg/dL (ref 0.44–1.00)
GFR, Estimated: >60 mL/min (ref >60)
Glucose, Bld: 168 mg/dL — ABNORMAL HIGH (ref 70–99)
Potassium: 3.5 mmol/L (ref 3.5–5.1)
Sodium: 135 mmol/L (ref 135–145)

## 2020-09-01 LAB — GLUCOSE, CAPILLARY
Glucose-Capillary: 198 mg/dL — ABNORMAL HIGH (ref 70–99)
Glucose-Capillary: 193 mg/dL — ABNORMAL HIGH (ref 70–99)

## 2020-09-01 LAB — CBC
HCT: 43.1 % (ref 36.0–46.0)
Hemoglobin: 14.6 g/dL (ref 12.0–15.0)
MCH: 28.6 pg (ref 26.0–34.0)
MCHC: 33.9 g/dL (ref 30.0–36.0)
MCV: 84.5 fL (ref 80.0–100.0)
Platelets: 236 10*3/uL (ref 150–400)
RBC: 5.1 MIL/uL (ref 3.87–5.11)
RDW: 13.2 % (ref 11.5–15.5)
WBC: 6.9 10*3/uL (ref 4.0–10.5)
nRBC: 0 % (ref 0.0–0.2)

## 2020-09-01 LAB — POCT ACTIVATED CLOTTING TIME
Activated Clotting Time: 273 seconds
Activated Clotting Time: 333 seconds

## 2020-09-01 MED ORDER — CLOPIDOGREL BISULFATE 75 MG PO TABS: 75.0000 mg | ORAL_TABLET | Freq: Every day | ORAL | Status: DC

## 2020-09-01 MED ORDER — ASPIRIN 81 MG PO CHEW: 81.0000 mg | CHEWABLE_TABLET | Freq: Every day | ORAL | 11 refills | Status: DC

## 2020-09-01 NOTE — Discharge Summary (Signed)
Physician Discharge Summary      Patient ID: GENELLE ECONOMOU MRN: [id number] DOB/AGE: [id number] 77 y.o.  Admit date: 08/31/2020 Discharge date: 09/01/2020  Primary Discharge Diagnosis chest pain, abnormal stress test Secondary Discharge Diagnosis coronary artery disease  Significant Diagnostic Studies: coronary angiography: Prox LAD to Mid LAD lesion is 99% stenosed   Consults: None  Hospital Course: 79 year old female with a recent history of chest pain and shortness of breath with an abnormal functional study who elected for left cardiac catheterization with coronary angiography on 08/31/2020. Findings were notable for normal left ventricular function with one-vessel coronary artery disease with high-grade 99% stenosis proximal/mid LAD. The patient underwent successful PCI with a 3.0 x 22 mm Resolute Onyx DES to the proximal/mid LAD. The patient reports feeling well this morning with no recurrent chest pain or shortness of breath even with ambulation in her room.   Discharge Exam: Blood pressure (!) 142/93, pulse (!) 57, temperature 97.7 F (36.5 C), temperature source Oral, resp. rate 16, height 5\' 5"  (1.651 m), weight 77.6 kg, SpO2 96 %.    General appearance: alert, cooperative, appears stated age and no distress Head: Normocephalic, without obvious abnormality, atraumatic Eyes: negative Back: negative Resp: normal effort of breathing on room air. Extremities: extremities normal, atraumatic, no cyanosis or edema Skin: Skin color, texture, turgor normal. No rashes or lesions Neurologic: Grossly normal Incision/Wound: right radial access site- Tegaderm removed, revealing slight ecchymoses without edema, hematoma, warmth, or significant tenderness  Labs:   Lab Results  Component Value Date   WBC 6.9 09/01/2020   HGB 14.6 09/01/2020   HCT 43.1 09/01/2020   MCV 84.5 09/01/2020   PLT 236 09/01/2020    Recent Labs  Lab 09/01/20 0554  NA 135  K 3.5  CL 102  CO2 25   BUN 21  CREATININE 0.86  CALCIUM 9.1  GLUCOSE 168*      Radiology: none Telemetry: sinus rhythm  FOLLOW UP PLANS AND APPOINTMENTS Discharge Instructions    AMB Referral to Cardiac Rehabilitation - Phase II   Complete by: As directed    Diagnosis: Coronary Stents   After initial evaluation and assessments completed: Virtual Based Care may be provided alone or in conjunction with Phase 2 Cardiac Rehab based on patient barriers.: Yes     Allergies as of 09/01/2020      Reactions   Amoxicillin Diarrhea, Itching, Swelling   Penicillins Swelling   Reaction:10 -15 Years      Medication List    TAKE these medications   amLODipine 2.5 MG tablet Commonly known as: NORVASC Take 2.5 mg by mouth daily.   aspirin 81 MG chewable tablet Chew 1 tablet (81 mg total) by mouth daily.   clopidogrel 75 MG tablet Commonly known as: PLAVIX Take 1 tablet (75 mg total) by mouth daily with breakfast.   glimepiride 2 MG tablet Commonly known as: AMARYL Take 2 mg by mouth daily.   Invokamet XR 50-1000 MG Tb24 Generic drug: Canagliflozin-metFORMIN HCl ER Take 1 tablet by mouth daily.   levothyroxine 100 MCG tablet Commonly known as: SYNTHROID Take 100 mcg by mouth daily.   simvastatin 20 MG tablet Commonly known as: ZOCOR Take 20 mg by mouth at bedtime.   triamterene-hydrochlorothiazide 37.5-25 MG capsule Commonly known as: DYAZIDE Take 1 capsule by mouth daily.       Follow-up Information    Paraschos, Alexander, MD. Go in 1 week(s).   Specialty: Cardiology Contact information: [address] [place] [facility] [place] [address] [phone]               BRING ALL MEDICATIONS WITH YOU TO FOLLOW UP APPOINTMENTS  Time spent with patient to include physician time: 25 minutes Signed:  Leanora Ivanoff PA-C 09/01/2020, 8:34 AM

## 2020-09-01 NOTE — Plan of Care (Signed)

## 2020-09-09 DIAGNOSIS — E782 Mixed hyperlipidemia: Secondary | ICD-10-CM | POA: Diagnosis not present

## 2020-09-09 DIAGNOSIS — R079 Chest pain, unspecified: Secondary | ICD-10-CM | POA: Diagnosis not present

## 2020-09-09 DIAGNOSIS — I10 Essential (primary) hypertension: Secondary | ICD-10-CM | POA: Diagnosis not present

## 2020-09-09 DIAGNOSIS — Z955 Presence of coronary angioplasty implant and graft: Secondary | ICD-10-CM | POA: Diagnosis not present

## 2020-10-24 DIAGNOSIS — E782 Mixed hyperlipidemia: Secondary | ICD-10-CM | POA: Diagnosis not present

## 2020-10-24 DIAGNOSIS — E1151 Type 2 diabetes mellitus with diabetic peripheral angiopathy without gangrene: Secondary | ICD-10-CM | POA: Diagnosis not present

## 2020-10-31 DIAGNOSIS — E119 Type 2 diabetes mellitus without complications: Secondary | ICD-10-CM | POA: Diagnosis not present

## 2020-10-31 DIAGNOSIS — Z0000 Encounter for general adult medical examination without abnormal findings: Secondary | ICD-10-CM | POA: Diagnosis not present

## 2020-12-08 DIAGNOSIS — Z8249 Family history of ischemic heart disease and other diseases of the circulatory system: Secondary | ICD-10-CM | POA: Diagnosis not present

## 2020-12-08 DIAGNOSIS — Z955 Presence of coronary angioplasty implant and graft: Secondary | ICD-10-CM | POA: Diagnosis not present

## 2020-12-08 DIAGNOSIS — I10 Essential (primary) hypertension: Secondary | ICD-10-CM | POA: Diagnosis not present

## 2020-12-08 DIAGNOSIS — E782 Mixed hyperlipidemia: Secondary | ICD-10-CM | POA: Diagnosis not present

## 2020-12-30 DIAGNOSIS — E782 Mixed hyperlipidemia: Secondary | ICD-10-CM | POA: Diagnosis not present

## 2021-03-15 DIAGNOSIS — E119 Type 2 diabetes mellitus without complications: Secondary | ICD-10-CM | POA: Diagnosis not present

## 2021-04-05 DIAGNOSIS — I10 Essential (primary) hypertension: Secondary | ICD-10-CM | POA: Diagnosis not present

## 2021-04-05 DIAGNOSIS — Z955 Presence of coronary angioplasty implant and graft: Secondary | ICD-10-CM | POA: Diagnosis not present

## 2021-04-05 DIAGNOSIS — E782 Mixed hyperlipidemia: Secondary | ICD-10-CM | POA: Diagnosis not present

## 2021-04-24 DIAGNOSIS — E782 Mixed hyperlipidemia: Secondary | ICD-10-CM | POA: Diagnosis not present

## 2021-04-24 DIAGNOSIS — E1151 Type 2 diabetes mellitus with diabetic peripheral angiopathy without gangrene: Secondary | ICD-10-CM | POA: Diagnosis not present

## 2021-05-01 DIAGNOSIS — Z955 Presence of coronary angioplasty implant and graft: Secondary | ICD-10-CM | POA: Diagnosis not present

## 2021-05-01 DIAGNOSIS — E119 Type 2 diabetes mellitus without complications: Secondary | ICD-10-CM | POA: Diagnosis not present

## 2021-05-16 DIAGNOSIS — Z96651 Presence of right artificial knee joint: Secondary | ICD-10-CM | POA: Diagnosis not present

## 2021-05-16 DIAGNOSIS — M25561 Pain in right knee: Secondary | ICD-10-CM | POA: Diagnosis not present

## 2021-05-16 DIAGNOSIS — Z96653 Presence of artificial knee joint, bilateral: Secondary | ICD-10-CM | POA: Diagnosis not present

## 2021-05-16 DIAGNOSIS — Z96652 Presence of left artificial knee joint: Secondary | ICD-10-CM | POA: Diagnosis not present

## 2021-05-16 DIAGNOSIS — M25562 Pain in left knee: Secondary | ICD-10-CM | POA: Diagnosis not present

## 2021-08-01 DIAGNOSIS — E1165 Type 2 diabetes mellitus with hyperglycemia: Secondary | ICD-10-CM | POA: Diagnosis not present

## 2021-09-07 DIAGNOSIS — Z23 Encounter for immunization: Secondary | ICD-10-CM | POA: Diagnosis not present

## 2021-09-07 DIAGNOSIS — E782 Mixed hyperlipidemia: Secondary | ICD-10-CM | POA: Diagnosis not present

## 2021-09-07 DIAGNOSIS — Z955 Presence of coronary angioplasty implant and graft: Secondary | ICD-10-CM | POA: Diagnosis not present

## 2021-10-09 DIAGNOSIS — Z03818 Encounter for observation for suspected exposure to other biological agents ruled out: Secondary | ICD-10-CM | POA: Diagnosis not present

## 2021-10-09 DIAGNOSIS — Z20822 Contact with and (suspected) exposure to covid-19: Secondary | ICD-10-CM | POA: Diagnosis not present

## 2021-11-03 DIAGNOSIS — E782 Mixed hyperlipidemia: Secondary | ICD-10-CM | POA: Diagnosis not present

## 2021-11-03 DIAGNOSIS — E1151 Type 2 diabetes mellitus with diabetic peripheral angiopathy without gangrene: Secondary | ICD-10-CM | POA: Diagnosis not present

## 2021-11-09 DIAGNOSIS — Z0000 Encounter for general adult medical examination without abnormal findings: Secondary | ICD-10-CM | POA: Diagnosis not present

## 2021-11-09 DIAGNOSIS — Z1389 Encounter for screening for other disorder: Secondary | ICD-10-CM | POA: Diagnosis not present

## 2021-11-09 DIAGNOSIS — E1165 Type 2 diabetes mellitus with hyperglycemia: Secondary | ICD-10-CM | POA: Diagnosis not present

## 2022-02-02 DIAGNOSIS — E1165 Type 2 diabetes mellitus with hyperglycemia: Secondary | ICD-10-CM | POA: Diagnosis not present

## 2022-02-06 DIAGNOSIS — E1165 Type 2 diabetes mellitus with hyperglycemia: Secondary | ICD-10-CM | POA: Diagnosis not present

## 2022-02-06 DIAGNOSIS — Z794 Long term (current) use of insulin: Secondary | ICD-10-CM | POA: Diagnosis not present

## 2022-03-12 DIAGNOSIS — E782 Mixed hyperlipidemia: Secondary | ICD-10-CM | POA: Diagnosis not present

## 2022-03-12 DIAGNOSIS — E1151 Type 2 diabetes mellitus with diabetic peripheral angiopathy without gangrene: Secondary | ICD-10-CM | POA: Diagnosis not present

## 2022-03-12 DIAGNOSIS — Z955 Presence of coronary angioplasty implant and graft: Secondary | ICD-10-CM | POA: Diagnosis not present

## 2022-03-12 DIAGNOSIS — I10 Essential (primary) hypertension: Secondary | ICD-10-CM | POA: Diagnosis not present

## 2022-03-15 DIAGNOSIS — H43813 Vitreous degeneration, bilateral: Secondary | ICD-10-CM | POA: Diagnosis not present

## 2022-04-04 DIAGNOSIS — Z794 Long term (current) use of insulin: Secondary | ICD-10-CM | POA: Diagnosis not present

## 2022-04-04 DIAGNOSIS — E1165 Type 2 diabetes mellitus with hyperglycemia: Secondary | ICD-10-CM | POA: Diagnosis not present

## 2022-04-11 DIAGNOSIS — Z794 Long term (current) use of insulin: Secondary | ICD-10-CM | POA: Diagnosis not present

## 2022-04-11 DIAGNOSIS — E1165 Type 2 diabetes mellitus with hyperglycemia: Secondary | ICD-10-CM | POA: Diagnosis not present

## 2022-04-24 IMAGING — MG DIGITAL SCREENING BILAT W/ TOMO W/ CAD
6 of 10 series · 6 of 30 positions shown · non-contrast
Comparison: Previous exam(s).

CLINICAL DATA: Screening.

EXAM:
DIGITAL SCREENING BILATERAL MAMMOGRAM WITH TOMO AND CAD

[R MLO synth-2D]
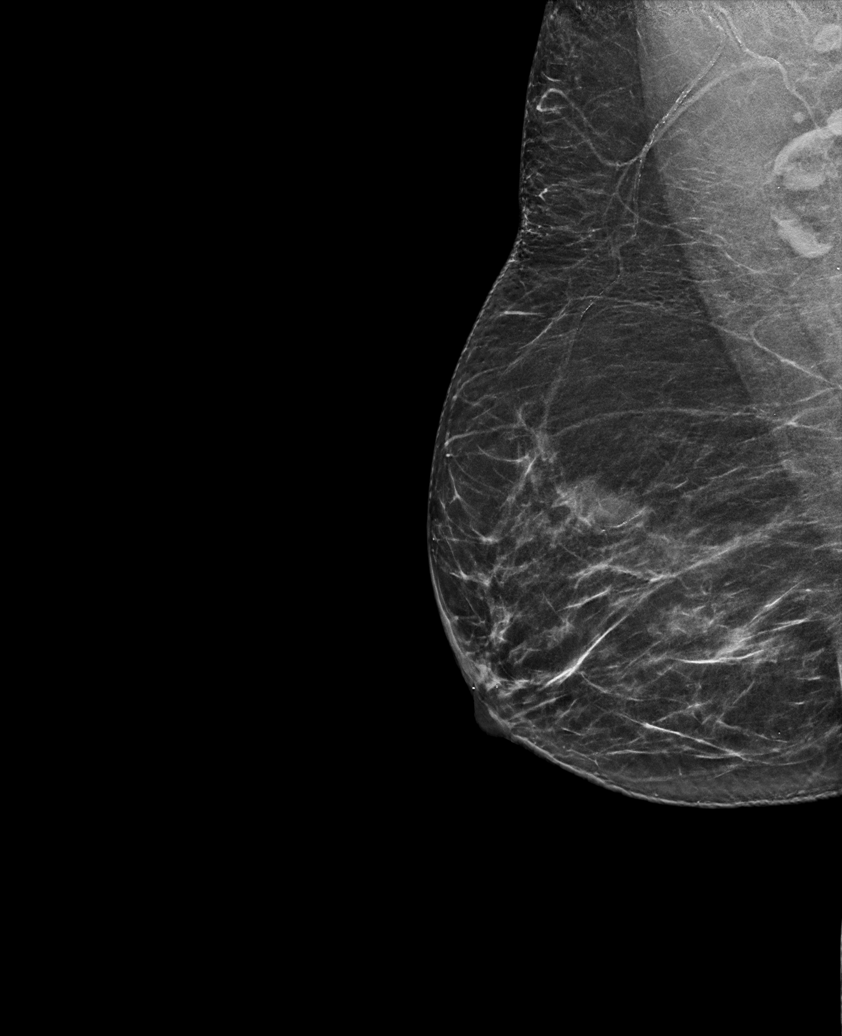

[L CC synth-2D]
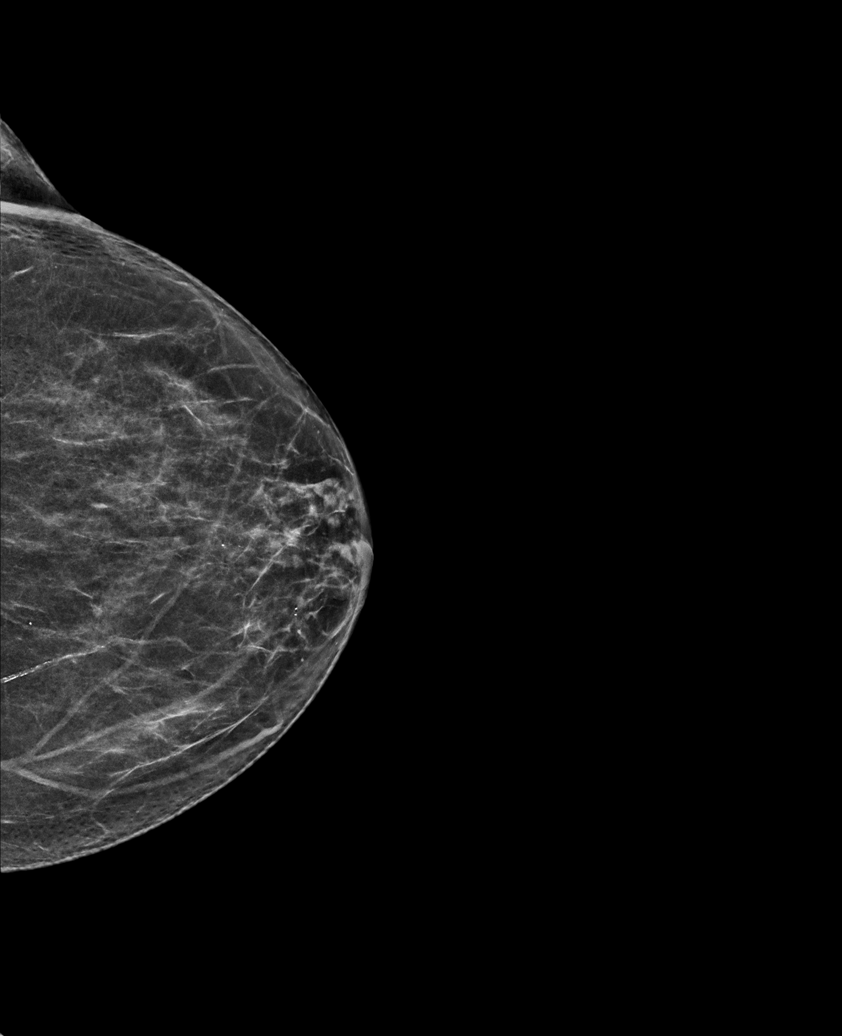

[R CC synth-2D]
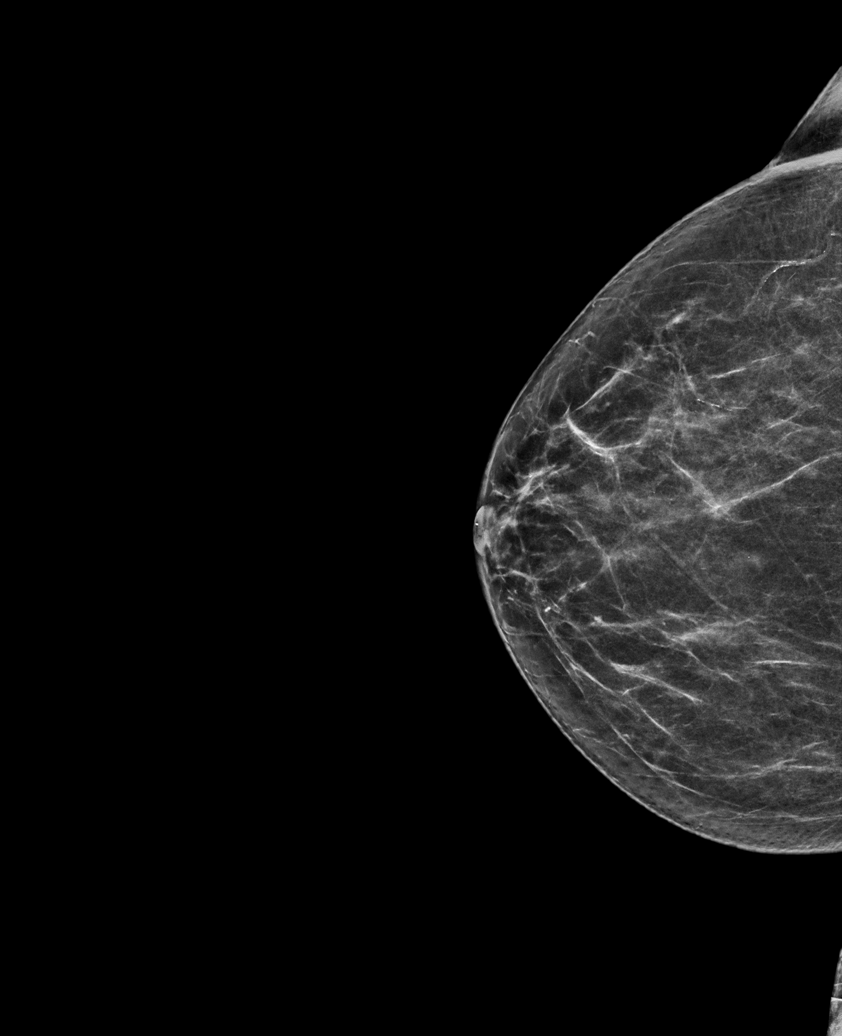

[L MLO synth-2D (1 of 2)]
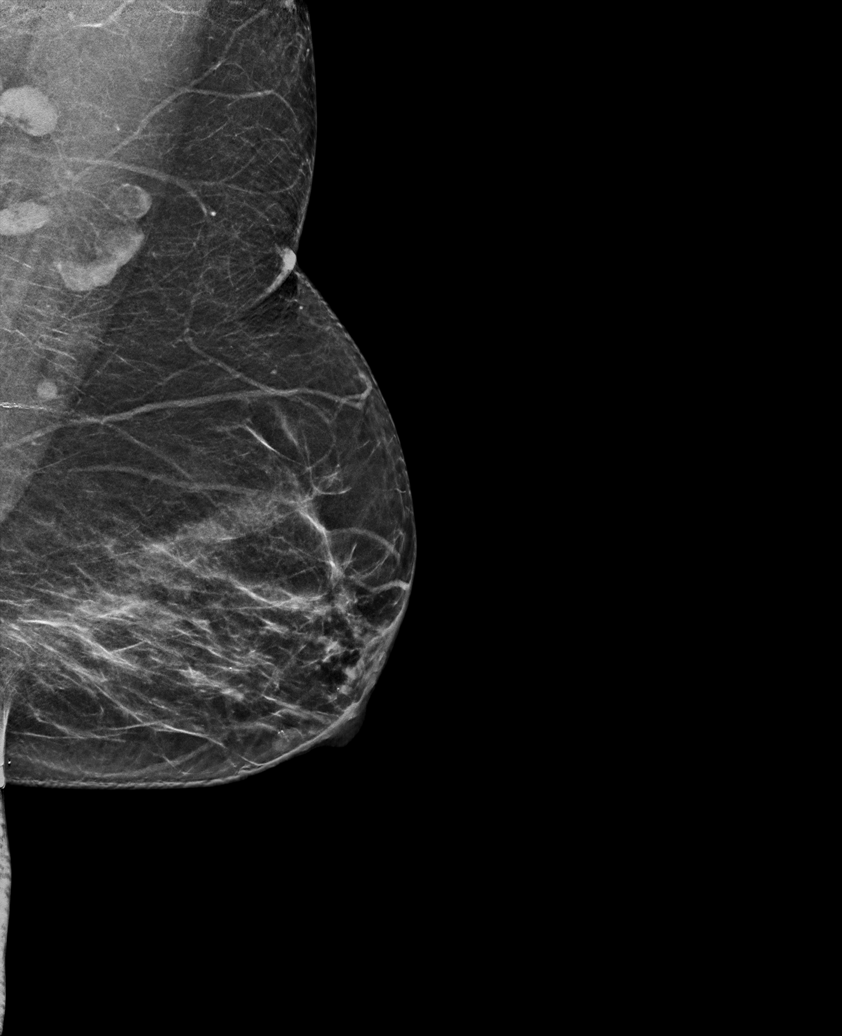

[L MLO synth-2D (2 of 2)]
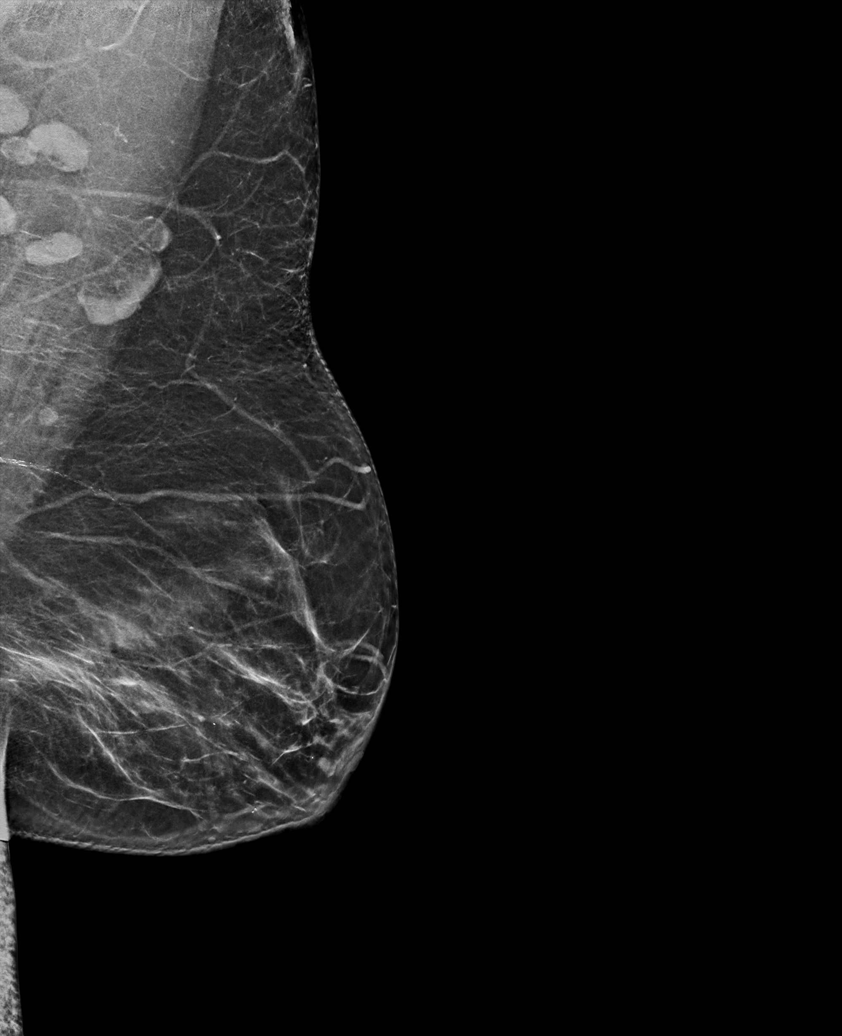

[L CC tomo · tomo slice 31/61.0]
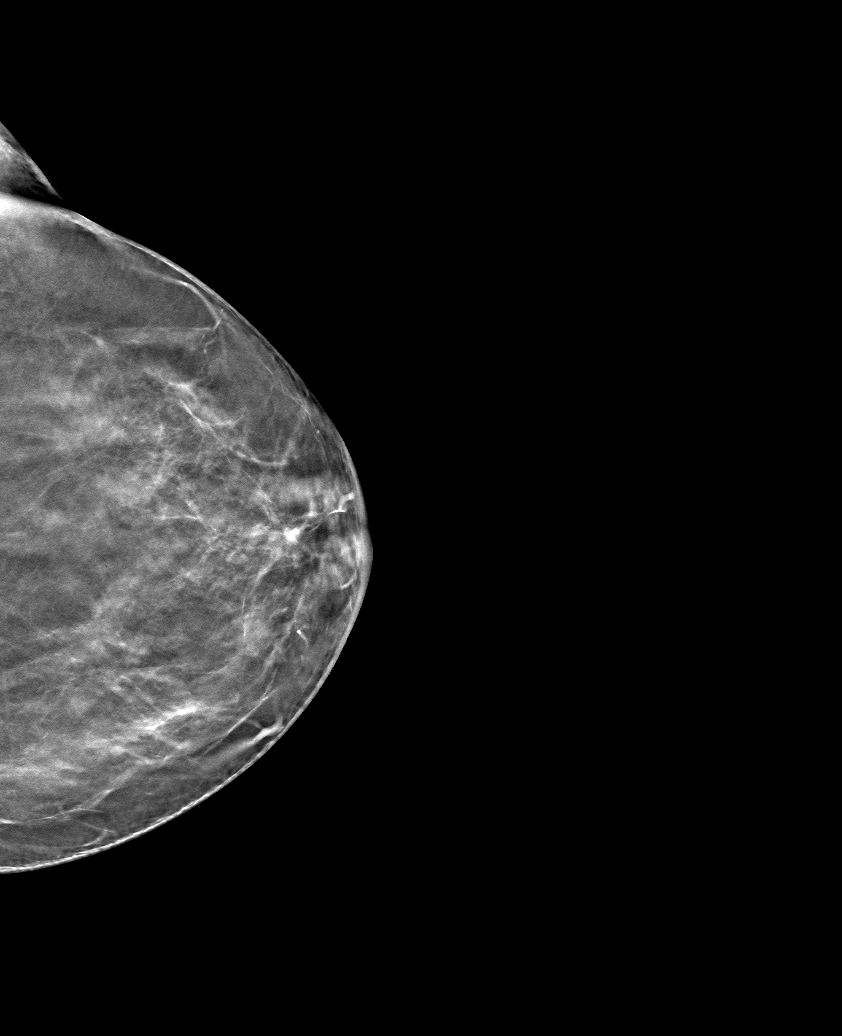

[6 of 30 positions shown; findings below may reference images not displayed]

ACR Breast Density Category c: The breast tissue is heterogeneously
dense, which may obscure small masses.
FINDINGS: There are no findings suspicious for malignancy. Images were
processed with CAD.
IMPRESSION: No mammographic evidence of malignancy. A result letter of this
screening mammogram will be mailed directly to the patient.

RECOMMENDATION:
Screening mammogram in one year. (Code:[id number])

BI-RADS CATEGORY  1: Negative.

## 2022-05-14 DIAGNOSIS — Z794 Long term (current) use of insulin: Secondary | ICD-10-CM | POA: Diagnosis not present

## 2022-05-14 DIAGNOSIS — E1165 Type 2 diabetes mellitus with hyperglycemia: Secondary | ICD-10-CM | POA: Diagnosis not present

## 2022-06-21 DIAGNOSIS — Z794 Long term (current) use of insulin: Secondary | ICD-10-CM | POA: Diagnosis not present

## 2022-06-21 DIAGNOSIS — E1165 Type 2 diabetes mellitus with hyperglycemia: Secondary | ICD-10-CM | POA: Diagnosis not present

## 2022-08-07 DIAGNOSIS — Z96653 Presence of artificial knee joint, bilateral: Secondary | ICD-10-CM | POA: Diagnosis not present

## 2022-08-07 DIAGNOSIS — E1165 Type 2 diabetes mellitus with hyperglycemia: Secondary | ICD-10-CM | POA: Diagnosis not present

## 2022-08-07 DIAGNOSIS — Z794 Long term (current) use of insulin: Secondary | ICD-10-CM | POA: Diagnosis not present

## 2022-08-14 DIAGNOSIS — E119 Type 2 diabetes mellitus without complications: Secondary | ICD-10-CM | POA: Diagnosis not present

## 2022-08-14 DIAGNOSIS — I10 Essential (primary) hypertension: Secondary | ICD-10-CM | POA: Diagnosis not present

## 2022-08-14 DIAGNOSIS — Z794 Long term (current) use of insulin: Secondary | ICD-10-CM | POA: Diagnosis not present

## 2022-09-10 DIAGNOSIS — I2510 Atherosclerotic heart disease of native coronary artery without angina pectoris: Secondary | ICD-10-CM | POA: Diagnosis not present

## 2022-09-10 DIAGNOSIS — Z955 Presence of coronary angioplasty implant and graft: Secondary | ICD-10-CM | POA: Diagnosis not present

## 2022-09-10 DIAGNOSIS — E782 Mixed hyperlipidemia: Secondary | ICD-10-CM | POA: Diagnosis not present

## 2022-11-16 DIAGNOSIS — H2511 Age-related nuclear cataract, right eye: Secondary | ICD-10-CM | POA: Diagnosis not present

## 2022-11-16 DIAGNOSIS — H2513 Age-related nuclear cataract, bilateral: Secondary | ICD-10-CM | POA: Diagnosis not present

## 2022-11-16 DIAGNOSIS — E119 Type 2 diabetes mellitus without complications: Secondary | ICD-10-CM | POA: Diagnosis not present

## 2022-11-16 DIAGNOSIS — H2512 Age-related nuclear cataract, left eye: Secondary | ICD-10-CM | POA: Diagnosis not present

## 2022-11-16 DIAGNOSIS — Z0100 Encounter for examination of eyes and vision without abnormal findings: Secondary | ICD-10-CM | POA: Diagnosis not present

## 2022-11-16 DIAGNOSIS — H43813 Vitreous degeneration, bilateral: Secondary | ICD-10-CM | POA: Diagnosis not present

## 2022-12-12 DIAGNOSIS — E1165 Type 2 diabetes mellitus with hyperglycemia: Secondary | ICD-10-CM | POA: Diagnosis not present

## 2022-12-19 DIAGNOSIS — Z1331 Encounter for screening for depression: Secondary | ICD-10-CM | POA: Diagnosis not present

## 2022-12-19 DIAGNOSIS — E119 Type 2 diabetes mellitus without complications: Secondary | ICD-10-CM | POA: Diagnosis not present

## 2022-12-19 DIAGNOSIS — Z0000 Encounter for general adult medical examination without abnormal findings: Secondary | ICD-10-CM | POA: Diagnosis not present

## 2022-12-19 DIAGNOSIS — Z794 Long term (current) use of insulin: Secondary | ICD-10-CM | POA: Diagnosis not present

## 2023-03-15 DIAGNOSIS — Z794 Long term (current) use of insulin: Secondary | ICD-10-CM | POA: Diagnosis not present

## 2023-03-15 DIAGNOSIS — E1165 Type 2 diabetes mellitus with hyperglycemia: Secondary | ICD-10-CM | POA: Diagnosis not present

## 2023-03-18 DIAGNOSIS — H2513 Age-related nuclear cataract, bilateral: Secondary | ICD-10-CM | POA: Diagnosis not present

## 2023-03-18 DIAGNOSIS — H2511 Age-related nuclear cataract, right eye: Secondary | ICD-10-CM | POA: Diagnosis not present

## 2023-03-18 DIAGNOSIS — E119 Type 2 diabetes mellitus without complications: Secondary | ICD-10-CM | POA: Diagnosis not present

## 2023-03-18 DIAGNOSIS — H2512 Age-related nuclear cataract, left eye: Secondary | ICD-10-CM | POA: Diagnosis not present

## 2023-03-18 DIAGNOSIS — H43813 Vitreous degeneration, bilateral: Secondary | ICD-10-CM | POA: Diagnosis not present

## 2023-03-22 DIAGNOSIS — Z794 Long term (current) use of insulin: Secondary | ICD-10-CM | POA: Diagnosis not present

## 2023-03-22 DIAGNOSIS — E1165 Type 2 diabetes mellitus with hyperglycemia: Secondary | ICD-10-CM | POA: Diagnosis not present

## 2023-03-27 DIAGNOSIS — H2511 Age-related nuclear cataract, right eye: Secondary | ICD-10-CM | POA: Diagnosis not present

## 2023-03-27 DIAGNOSIS — H2512 Age-related nuclear cataract, left eye: Secondary | ICD-10-CM | POA: Diagnosis not present

## 2023-04-01 ENCOUNTER — Encounter: Payer: Self-pay | Admitting: Ophthalmology

## 2023-04-02 NOTE — Discharge Instructions (Signed)

## 2023-04-02 NOTE — Anesthesia Preprocedure Evaluation (Addendum)
Anesthesia Evaluation  Patient identified by MRN, date of birth, ID band Patient awake    Reviewed: Allergy & Precautions, H&P , NPO status , Patient's Chart, lab work & pertinent test results  Airway Mallampati: I  TM Distance: >3 FB Neck ROM: Full    Dental no notable dental hx. (+) Upper Dentures, Lower Dentures   Pulmonary former smoker   Pulmonary exam normal breath sounds clear to auscultation       Cardiovascular hypertension, + angina  + CAD  Normal cardiovascular exam Rhythm:Regular Rate:Normal     Neuro/Psych negative neurological ROS  negative psych ROS   GI/Hepatic negative GI ROS, Neg liver ROS,,,  Endo/Other  negative endocrine ROS    Renal/GU negative Renal ROS  negative genitourinary   Musculoskeletal negative musculoskeletal ROS (+)    Abdominal   Peds negative pediatric ROS (+)  Hematology negative hematology ROS (+)   Anesthesia Other Findings Hypertension  Anginal pain (HCC) Psoriasis  Family history of adverse reaction to anesthesia--patient denies any family or personal history of adverse reaction to anesthesia Wears dentures     Reproductive/Obstetrics negative OB ROS                             Anesthesia Physical Anesthesia Plan  ASA: 3  Anesthesia Plan: MAC   Post-op Pain Management:    Induction: Intravenous  PONV Risk Score and Plan:   Airway Management Planned: Natural Airway and Nasal Cannula  Additional Equipment:   Intra-op Plan:   Post-operative Plan:   Informed Consent: I have reviewed the patients History and Physical, chart, labs and discussed the procedure including the risks, benefits and alternatives for the proposed anesthesia with the patient or authorized representative who has indicated his/her understanding and acceptance.     Dental Advisory Given  Plan Discussed with: Anesthesiologist, CRNA and Surgeon  Anesthesia  Plan Comments: (Patient consented for risks of anesthesia including but not limited to:  - adverse reactions to medications - damage to eyes, teeth, lips or other oral mucosa - nerve damage due to positioning  - sore throat or hoarseness - Damage to heart, brain, nerves, lungs, other parts of body or loss of life  Patient voiced understanding.)        Anesthesia Quick Evaluation

## 2023-04-09 ENCOUNTER — Other Ambulatory Visit: Payer: Self-pay

## 2023-04-09 ENCOUNTER — Ambulatory Visit
Admission: RE | Admit: 2023-04-09 | Discharge: 2023-04-09 | Disposition: A | Discharge status: Home / Self Care | Payer: Medicare HMO | Attending: Ophthalmology | Admitting: Ophthalmology

## 2023-04-09 ENCOUNTER — Encounter
Admission: RE | Disposition: A | Discharge status: Home / Self Care | Payer: Self-pay | Source: Home / Self Care | Attending: Ophthalmology

## 2023-04-09 ENCOUNTER — Encounter: Payer: Self-pay | Admitting: Ophthalmology

## 2023-04-09 ENCOUNTER — Ambulatory Visit: Payer: Medicare HMO | Admitting: Anesthesiology

## 2023-04-09 DIAGNOSIS — H269 Unspecified cataract: Secondary | ICD-10-CM | POA: Diagnosis not present

## 2023-04-09 DIAGNOSIS — E1136 Type 2 diabetes mellitus with diabetic cataract: Secondary | ICD-10-CM | POA: Insufficient documentation

## 2023-04-09 DIAGNOSIS — I25119 Atherosclerotic heart disease of native coronary artery with unspecified angina pectoris: Secondary | ICD-10-CM | POA: Diagnosis not present

## 2023-04-09 DIAGNOSIS — Z87891 Personal history of nicotine dependence: Secondary | ICD-10-CM | POA: Insufficient documentation

## 2023-04-09 DIAGNOSIS — Z794 Long term (current) use of insulin: Secondary | ICD-10-CM | POA: Diagnosis not present

## 2023-04-09 DIAGNOSIS — Z7984 Long term (current) use of oral hypoglycemic drugs: Secondary | ICD-10-CM | POA: Insufficient documentation

## 2023-04-09 DIAGNOSIS — I10 Essential (primary) hypertension: Secondary | ICD-10-CM | POA: Insufficient documentation

## 2023-04-09 DIAGNOSIS — H2511 Age-related nuclear cataract, right eye: Secondary | ICD-10-CM | POA: Diagnosis not present

## 2023-04-09 HISTORY — PX: CATARACT EXTRACTION W/PHACO: SHX586

## 2023-04-09 HISTORY — DX: Family history of other specified conditions: Z84.89

## 2023-04-09 HISTORY — DX: Presence of dental prosthetic device (complete) (partial): Z97.2

## 2023-04-09 LAB — GLUCOSE, CAPILLARY: Glucose-Capillary: 167 mg/dL — ABNORMAL HIGH (ref 70–99)

## 2023-04-09 SURGERY — PHACOEMULSIFICATION, CATARACT, WITH IOL INSERTION
Anesthesia: Monitor Anesthesia Care | Site: Eye | Laterality: Right

## 2023-04-09 MED ORDER — PHENYLEPHRINE HCL 10 % OP SOLN: 1.0000 [drp] | OPHTHALMIC | Status: DC | PRN

## 2023-04-09 MED ORDER — LACTATED RINGERS IV SOLN: INTRAVENOUS | Status: DC

## 2023-04-09 MED ORDER — SIGHTPATH DOSE#1 BSS IO SOLN
INTRAOCULAR | Status: DC | PRN
  Administered 2023-04-09: 57 mL via OPHTHALMIC

## 2023-04-09 MED ORDER — FENTANYL CITRATE (PF) 100 MCG/2ML IJ SOLN
INTRAMUSCULAR | Status: DC | PRN
  Administered 2023-04-09: 50 ug via INTRAVENOUS

## 2023-04-09 MED ORDER — SODIUM CHLORIDE 0.9% FLUSH
INTRAVENOUS | Status: DC | PRN
  Administered 2023-04-09: 10 mL via INTRAVENOUS

## 2023-04-09 MED ORDER — MOXIFLOXACIN HCL 0.5 % OP SOLN
OPHTHALMIC | Status: DC | PRN
  Administered 2023-04-09: .2 mL via OPHTHALMIC

## 2023-04-09 MED ORDER — SIGHTPATH DOSE#1 BSS IO SOLN
INTRAOCULAR | Status: DC | PRN
  Administered 2023-04-09: 2 mL via INTRAMUSCULAR

## 2023-04-09 MED ORDER — BRIMONIDINE TARTRATE-TIMOLOL 0.2-0.5 % OP SOLN
OPHTHALMIC | Status: DC | PRN
  Administered 2023-04-09: 1 [drp] via OPHTHALMIC

## 2023-04-09 MED ORDER — TETRACAINE HCL 0.5 % OP SOLN
1.0000 [drp] | OPHTHALMIC | Status: DC | PRN
  Administered 2023-04-09 (×3): 1 [drp] via OPHTHALMIC

## 2023-04-09 MED ORDER — MIDAZOLAM HCL 2 MG/2ML IJ SOLN
INTRAMUSCULAR | Status: DC | PRN
  Administered 2023-04-09: 1 mg via INTRAVENOUS

## 2023-04-09 MED ORDER — CYCLOPENTOLATE HCL 2 % OP SOLN: 1.0000 [drp] | OPHTHALMIC | Status: DC | PRN

## 2023-04-09 MED ORDER — ARMC OPHTHALMIC DILATING DROPS
1.0000 | OPHTHALMIC | Status: DC | PRN
  Administered 2023-04-09 (×3): 1 via OPHTHALMIC

## 2023-04-09 MED ORDER — SIGHTPATH DOSE#1 BSS IO SOLN
INTRAOCULAR | Status: DC | PRN
  Administered 2023-04-09: 15 mL via INTRAOCULAR

## 2023-04-09 MED ORDER — SIGHTPATH DOSE#1 NA CHONDROIT SULF-NA HYALURON 40-17 MG/ML IO SOLN
INTRAOCULAR | Status: DC | PRN
  Administered 2023-04-09: 1 mL via INTRAOCULAR

## 2023-04-09 SURGICAL SUPPLY — 13 items
ANGLE REVERSE CUT SHRT 25GA (CUTTER) ×1
CANNULA ANT/CHMB 27G (MISCELLANEOUS) IMPLANT
CANNULA ANT/CHMB 27GA (MISCELLANEOUS) IMPLANT
CATARACT SUITE SIGHTPATH (MISCELLANEOUS) ×1 IMPLANT
CYSTOTOME ANGL RVRS SHRT 25G (CUTTER) ×1 IMPLANT
CYSTOTOME ANGL RVRS SHRT 25GA (CUTTER) ×1 IMPLANT
FEE CATARACT SUITE SIGHTPATH (MISCELLANEOUS) ×1 IMPLANT
GLOVE BIOGEL PI IND STRL 8 (GLOVE) ×1 IMPLANT
GLOVE SURG ENC TEXT LTX SZ8 (GLOVE) ×1 IMPLANT
LENS IOL TECNIS EYHANCE 21.0 (Intraocular Lens) IMPLANT
NDL FILTER BLUNT 18X1 1/2 (NEEDLE) ×1 IMPLANT
NEEDLE FILTER BLUNT 18X1 1/2 (NEEDLE) ×1 IMPLANT
SYR 3ML LL SCALE MARK (SYRINGE) ×1 IMPLANT

## 2023-04-09 NOTE — H&P (Signed)
[facility]   Primary Care Physician:  Danella Penton, MD Ophthalmologist: Dr. Druscilla Brownie  Pre-Procedure History & Physical: HPI:  Emma Patrick is a 82 y.o. female here for cataract surgery.   Past Medical History:  Diagnosis Date   Anginal pain (HCC)    Family history of adverse reaction to anesthesia    Hypertension    Psoriasis 2005   Wears dentures    full upper and lower    Past Surgical History:  Procedure Laterality Date   ABDOMINAL HYSTERECTOMY     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 Authorizing Provider  amLODipine (NORVASC) 2.5 MG tablet Take 5 mg by mouth daily. 07/14/20  Yes [provider]  glipiZIDE (GLUCOTROL) 10 MG tablet Take 20 mg by mouth daily before breakfast.   Yes [provider]  insulin glargine (LANTUS) 100 UNIT/ML injection Inject 40 Units into the skin 2 (two) times daily.   Yes [provider]  levothyroxine (SYNTHROID) 100 MCG tablet Take 100 mcg by mouth daily. 07/15/20  Yes [provider]  metFORMIN (GLUCOPHAGE) 500 MG tablet Take 1,000 mg by mouth 2 (two) times daily with a meal.   Yes [provider]  rosuvastatin (CRESTOR) 10 MG tablet Take 10 mg by mouth daily.   Yes [provider]  triamterene-hydrochlorothiazide (DYAZIDE) 37.5-25 MG capsule Take 1 capsule by mouth daily. 07/14/20  Yes [provider]    Allergies as of 03/25/2023 - Review Complete 08/31/2020   Allergen Reaction Noted   Amoxicillin Diarrhea, Itching, and Swelling 03/23/2014   Penicillins Swelling 08/12/2020    Family History  Problem Relation Age of Onset   Breast cancer Sister 16   Dementia Mother    Heart attack Mother     Social History   Socioeconomic History   Marital status: Married    Spouse name: Kevin Fenton    Number of children: 3   Years of education: Not on file   Highest education level: Not on file  Occupational History   Occupation: retired     Comment: production superv: [employer]  Tobacco Use   Smoking status: Former    Types: Cigarettes    Quit date: 1972    Years since quitting: 52.5   Smokeless tobacco: Never  Vaping Use   Vaping Use: Never used  Substance and Sexual Activity   Alcohol use: Never   Drug use: Never   Sexual activity: Not on file  Other Topics Concern   Not on file  Social History Narrative   Lives at home with spouse : 1 daughter, 2 sons    Social Determinants of Corporate investment banker Strain: Not on file  Food Insecurity: Not on file  Transportation Needs: Not on file  Physical Activity: Not on file  Stress: Not on file  Social Connections: Not on file  Intimate Partner Violence: Not on file    Review of Systems: See HPI, otherwise negative ROS  Physical Exam: BP (!) 164/83   Pulse 79  Temp (!) 97 F (36.1 C) (Temporal)   Resp 18   Ht 5\' 4"  (1.626 m)   Wt 78.5 kg   SpO2 96%   BMI 29.70 kg/m  General:   Alert, cooperative in NAD Head:  Normocephalic and atraumatic. Respiratory:  Normal work of breathing. Cardiovascular:  RRR  Impression/Plan: Emma Patrick is here for cataract surgery.  Risks, benefits, limitations, and alternatives regarding cataract surgery have been reviewed with the patient.  Questions have been answered.  All parties agreeable.   Galen Manila, MD  04/09/2023, 7:49 AM

## 2023-04-09 NOTE — Anesthesia Postprocedure Evaluation (Signed)
Anesthesia Post Note  Patient: Emma Patrick  Procedure(s) Performed: CATARACT EXTRACTION PHACO AND INTRAOCULAR LENS PLACEMENT (IOC) RIGHT DIABETIC 5.64 00:39.0 (Right: Eye)  Patient location during evaluation: PACU Anesthesia Type: MAC Level of consciousness: awake and alert Pain management: pain level controlled Vital Signs Assessment: post-procedure vital signs reviewed and stable Respiratory status: spontaneous breathing, nonlabored ventilation, respiratory function stable and patient connected to nasal cannula oxygen Cardiovascular status: stable and blood pressure returned to baseline Postop Assessment: no apparent nausea or vomiting Anesthetic complications: no   No notable events documented.   Last Vitals:  Vitals:   04/09/23 0814 04/09/23 0820  BP: 131/69 (!) 148/68  Pulse: 79 82  Resp: 14 16  Temp: (!) 36.1 C (!) 36.1 C  SpO2: 94% 94%    Last Pain:  Vitals:   04/09/23 0820  TempSrc:   PainSc: 0-No pain                 Bobbi Yount C Lionel Woodberry

## 2023-04-09 NOTE — Op Note (Signed)
PREOPERATIVE DIAGNOSIS:  Nuclear sclerotic cataract of the right eye.   POSTOPERATIVE DIAGNOSIS:  Cataract   OPERATIVE PROCEDURE:ORPROCALL@   SURGEON:  Galen Manila, MD.   ANESTHESIA:  Anesthesiologist: Marisue Humble, MD CRNA: Genia Del, CRNA  1.      Managed anesthesia care. 2.      0.45ml of Shugarcaine was instilled in the eye following the paracentesis.   COMPLICATIONS:  None.   TECHNIQUE:   Stop and chop   DESCRIPTION OF PROCEDURE:  The patient was examined and consented in the preoperative holding area where the aforementioned topical anesthesia was applied to the right eye and then brought back to the Operating Room where the right eye was prepped and draped in the usual sterile ophthalmic fashion and a lid speculum was placed. A paracentesis was created with the side port blade and the anterior chamber was filled with viscoelastic. A near clear corneal incision was performed with the steel keratome. A continuous curvilinear capsulorrhexis was performed with a cystotome followed by the capsulorrhexis forceps. Hydrodissection and hydrodelineation were carried out with BSS on a blunt cannula. The lens was removed in a stop and chop  technique and the remaining cortical material was removed with the irrigation-aspiration handpiece. The capsular bag was inflated with viscoelastic and the Technis [id number]  lens was placed in the capsular bag without complication. The remaining viscoelastic was removed from the eye with the irrigation-aspiration handpiece. The wounds were hydrated. The anterior chamber was flushed with BSS and the eye was inflated to physiologic pressure. 0.40ml of Vigamox was placed in the anterior chamber. The wounds were found to be water tight. The eye was dressed with Combigan. The patient was given protective glasses to wear throughout the day and a shield with which to sleep tonight. The patient was also given drops with which to begin a drop regimen today and will  follow-up with me in one day. Implant Name Type Inv. Item Serial No. Manufacturer Lot No. LRB No. Used Action  LENS IOL TECNIS EYHANCE 21.0 - [id number] Intraocular Lens LENS IOL TECNIS EYHANCE 21.0 [id number] [employer]  Right 1 Implanted   Procedure(s): CATARACT EXTRACTION PHACO AND INTRAOCULAR LENS PLACEMENT (IOC) RIGHT DIABETIC 5.64 00:39.0 (Right)  Electronically signed: Galen Manila 04/09/2023 8:13 AM

## 2023-04-09 NOTE — Transfer of Care (Signed)
Immediate Anesthesia Transfer of Care Note  Patient: Emma Patrick  Procedure(s) Performed: CATARACT EXTRACTION PHACO AND INTRAOCULAR LENS PLACEMENT (IOC) RIGHT DIABETIC 5.64 00:39.0 (Right: Eye)  Patient Location: PACU  Anesthesia Type:MAC  Level of Consciousness: awake, alert , and oriented  Airway & Oxygen Therapy: Patient Spontanous Breathing  Post-op Assessment: Report given to RN and Post -op Vital signs reviewed and stable  Post vital signs: Reviewed and stable  Last Vitals:  Vitals Value Taken Time  BP 131/69 04/09/23 0814  Temp    Pulse 77 04/09/23 0815  Resp 18 04/09/23 0815  SpO2 93 % 04/09/23 0815  Vitals shown include unvalidated device data.  Last Pain:  Vitals:   04/09/23 0709  TempSrc: Temporal  PainSc: 0-No pain         Complications: No notable events documented.

## 2023-04-10 ENCOUNTER — Encounter: Payer: Self-pay | Admitting: Ophthalmology

## 2023-04-15 DIAGNOSIS — H2512 Age-related nuclear cataract, left eye: Secondary | ICD-10-CM | POA: Diagnosis not present

## 2023-04-17 ENCOUNTER — Encounter: Payer: Self-pay | Admitting: Ophthalmology

## 2023-04-18 ENCOUNTER — Encounter: Payer: Self-pay | Admitting: Ophthalmology

## 2023-04-18 NOTE — Anesthesia Preprocedure Evaluation (Addendum)
Anesthesia Evaluation  Patient identified by MRN, date of birth, ID band Patient awake    Reviewed: Allergy & Precautions, NPO status , Patient's Chart, lab work & pertinent test results  History of Anesthesia Complications Negative for: history of anesthetic complications  Airway Mallampati: III   Neck ROM: Full    Dental  (+) Upper Dentures, Lower Dentures   Pulmonary former smoker (quit 1972)   Pulmonary exam normal breath sounds clear to auscultation       Cardiovascular hypertension, + CAD (s/p stent, no longer on anticoagulation)  Normal cardiovascular exam Rhythm:Regular Rate:Normal  08-12-20 echo 1. Left ventricular ejection fraction, by estimation, is >75%. The left  ventricle has hyperdynamic function. The left ventricle has no regional  wall motion abnormalities. There is mild concentric left ventricular  hypertrophy. Left ventricular diastolic  parameters are consistent with Grade II diastolic dysfunction  (pseudonormalization).   2. Right ventricular systolic function is normal. The right ventricular  size is normal.   3. The mitral valve is normal in structure. Mild mitral valve  regurgitation.   4. The aortic valve is normal in structure. Aortic valve regurgitation is  not visualized.      Neuro/Psych negative neurological ROS     GI/Hepatic negative GI ROS,,,  Endo/Other  diabetes, Type 2    Renal/GU negative Renal ROS     Musculoskeletal   Abdominal   Peds  Hematology negative hematology ROS (+)   Anesthesia Other Findings Cardiology note 09/10/22:  82 year old female with a history of hypertension, hyperlipidemia, type 2 diabetes, obesity, and a strong family history of heart disease, originally referred for evaluation of a 2-week history of exertional dyspnea, exercise intolerance, and chest tightness with associated nausea and palpitations. Lexiscan Myoview revealed a medium reversible  defect of moderate severity present in the basal anterior, mid anterior, apical anterior, and apex location, consistent with ischemia. Cardiac catheterization revealed high-grade stenosis proximal/mid LAD. Patient underwent successful PCI receiving DES proximal/mid LAD, with resolution of symptoms. Patient has essential hypertension, blood pressure well controlled today on current BP medications.  Plan   1. Continue current medications 2. Counseled patient about low-sodium diet 3. Recommend Medtierranean Diet 4. Continue Crestor for hyperlipidemia management 5. Recommend regular exercise and weight loss 6. Return to clinic for follow-up in 6 months      Reproductive/Obstetrics                             Anesthesia Physical Anesthesia Plan  ASA: 2  Anesthesia Plan: General   Post-op Pain Management:    Induction: Intravenous  PONV Risk Score and Plan: 2 and Treatment may vary due to age or medical condition, TIVA and Midazolam  Airway Management Planned: Natural Airway and Nasal Cannula  Additional Equipment:   Intra-op Plan:   Post-operative Plan:   Informed Consent: I have reviewed the patients History and Physical, chart, labs and discussed the procedure including the risks, benefits and alternatives for the proposed anesthesia with the patient or authorized representative who has indicated his/her understanding and acceptance.       Plan Discussed with: CRNA  Anesthesia Plan Comments: (LMA/GETA backup discussed.  Patient consented for risks of anesthesia including but not limited to:  - adverse reactions to medications - damage to eyes, teeth, lips or other oral mucosa - nerve damage due to positioning  - sore throat or hoarseness - damage to heart, brain, nerves, lungs, other parts of body or  loss of life  Informed patient about role of CRNA in peri- and intra-operative care.  Patient voiced understanding.)        Anesthesia Quick  Evaluation

## 2023-04-19 NOTE — Discharge Instructions (Signed)

## 2023-04-23 ENCOUNTER — Ambulatory Visit: Payer: Medicare HMO | Admitting: Anesthesiology

## 2023-04-23 ENCOUNTER — Other Ambulatory Visit: Payer: Self-pay

## 2023-04-23 ENCOUNTER — Ambulatory Visit
Admission: RE | Admit: 2023-04-23 | Discharge: 2023-04-23 | Disposition: A | Discharge status: Home / Self Care | Payer: Medicare HMO | Attending: Ophthalmology | Admitting: Ophthalmology

## 2023-04-23 ENCOUNTER — Encounter
Admission: RE | Disposition: A | Discharge status: Home / Self Care | Payer: Self-pay | Source: Home / Self Care | Attending: Ophthalmology

## 2023-04-23 ENCOUNTER — Encounter: Payer: Self-pay | Admitting: Ophthalmology

## 2023-04-23 DIAGNOSIS — Z8249 Family history of ischemic heart disease and other diseases of the circulatory system: Secondary | ICD-10-CM | POA: Insufficient documentation

## 2023-04-23 DIAGNOSIS — Z87891 Personal history of nicotine dependence: Secondary | ICD-10-CM | POA: Insufficient documentation

## 2023-04-23 DIAGNOSIS — Z955 Presence of coronary angioplasty implant and graft: Secondary | ICD-10-CM | POA: Diagnosis not present

## 2023-04-23 DIAGNOSIS — I10 Essential (primary) hypertension: Secondary | ICD-10-CM | POA: Diagnosis not present

## 2023-04-23 DIAGNOSIS — Z794 Long term (current) use of insulin: Secondary | ICD-10-CM | POA: Diagnosis not present

## 2023-04-23 DIAGNOSIS — Z7984 Long term (current) use of oral hypoglycemic drugs: Secondary | ICD-10-CM | POA: Insufficient documentation

## 2023-04-23 DIAGNOSIS — E1136 Type 2 diabetes mellitus with diabetic cataract: Secondary | ICD-10-CM | POA: Insufficient documentation

## 2023-04-23 DIAGNOSIS — H2512 Age-related nuclear cataract, left eye: Secondary | ICD-10-CM | POA: Diagnosis not present

## 2023-04-23 DIAGNOSIS — I2510 Atherosclerotic heart disease of native coronary artery without angina pectoris: Secondary | ICD-10-CM | POA: Insufficient documentation

## 2023-04-23 HISTORY — DX: Other ill-defined heart diseases: I51.89

## 2023-04-23 HISTORY — PX: CATARACT EXTRACTION W/PHACO: SHX586

## 2023-04-23 HISTORY — DX: Nonrheumatic mitral (valve) insufficiency: I34.0

## 2023-04-23 LAB — GLUCOSE, CAPILLARY: Glucose-Capillary: 134 mg/dL — ABNORMAL HIGH (ref 70–99)

## 2023-04-23 SURGERY — PHACOEMULSIFICATION, CATARACT, WITH IOL INSERTION
Anesthesia: Monitor Anesthesia Care | Laterality: Left

## 2023-04-23 MED ORDER — SIGHTPATH DOSE#1 NA CHONDROIT SULF-NA HYALURON 40-17 MG/ML IO SOLN
INTRAOCULAR | Status: DC | PRN
  Administered 2023-04-23: 1 mL via INTRAOCULAR

## 2023-04-23 MED ORDER — SIGHTPATH DOSE#1 BSS IO SOLN
INTRAOCULAR | Status: DC | PRN
  Administered 2023-04-23: 83 mL via OPHTHALMIC

## 2023-04-23 MED ORDER — ARMC OPHTHALMIC DILATING DROPS
1.0000 | OPHTHALMIC | Status: DC | PRN
  Administered 2023-04-23 (×3): 1 via OPHTHALMIC

## 2023-04-23 MED ORDER — SIGHTPATH DOSE#1 BSS IO SOLN
INTRAOCULAR | Status: DC | PRN
  Administered 2023-04-23: 15 mL via INTRAOCULAR

## 2023-04-23 MED ORDER — LACTATED RINGERS IV SOLN: INTRAVENOUS | Status: DC

## 2023-04-23 MED ORDER — MIDAZOLAM HCL 2 MG/2ML IJ SOLN
INTRAMUSCULAR | Status: DC | PRN
  Administered 2023-04-23: 2 mg via INTRAVENOUS

## 2023-04-23 MED ORDER — MOXIFLOXACIN HCL 0.5 % OP SOLN
OPHTHALMIC | Status: DC | PRN
  Administered 2023-04-23: .2 mL via OPHTHALMIC

## 2023-04-23 MED ORDER — TETRACAINE HCL 0.5 % OP SOLN
1.0000 [drp] | OPHTHALMIC | Status: DC | PRN
  Administered 2023-04-23 (×3): 1 [drp] via OPHTHALMIC

## 2023-04-23 MED ORDER — BRIMONIDINE TARTRATE-TIMOLOL 0.2-0.5 % OP SOLN
OPHTHALMIC | Status: DC | PRN
  Administered 2023-04-23: 1 [drp] via OPHTHALMIC

## 2023-04-23 MED ORDER — SIGHTPATH DOSE#1 BSS IO SOLN
INTRAOCULAR | Status: DC | PRN
  Administered 2023-04-23: 2 mL

## 2023-04-23 SURGICAL SUPPLY — 11 items
ANGLE REVERSE CUT SHRT 25GA (CUTTER) ×1
CATARACT SUITE SIGHTPATH (MISCELLANEOUS) ×1 IMPLANT
CYSTOTOME ANGL RVRS SHRT 25G (CUTTER) ×1 IMPLANT
CYSTOTOME ANGL RVRS SHRT 25GA (CUTTER) ×1 IMPLANT
FEE CATARACT SUITE SIGHTPATH (MISCELLANEOUS) ×1 IMPLANT
GLOVE BIOGEL PI IND STRL 8 (GLOVE) ×1 IMPLANT
GLOVE SURG ENC TEXT LTX SZ8 (GLOVE) ×1 IMPLANT
LENS IOL TECNIS EYHANCE 21.5 (Intraocular Lens) IMPLANT
NDL FILTER BLUNT 18X1 1/2 (NEEDLE) ×1 IMPLANT
NEEDLE FILTER BLUNT 18X1 1/2 (NEEDLE) ×1 IMPLANT
SYR 3ML LL SCALE MARK (SYRINGE) ×1 IMPLANT

## 2023-04-23 NOTE — Op Note (Signed)
PREOPERATIVE DIAGNOSIS:  Nuclear sclerotic cataract of the left eye.   POSTOPERATIVE DIAGNOSIS:  Nuclear sclerotic cataract of the left eye.   OPERATIVE PROCEDURE:ORPROCALL@   SURGEON:  Galen Manila, MD.   ANESTHESIA:  Anesthesiologist: Reed Breech, MD CRNA: Domenic Moras, CRNA  1.      Managed anesthesia care. 2.     0.16ml of Shugarcaine was instilled following the paracentesis   COMPLICATIONS:  None.   TECHNIQUE:   Stop and chop   DESCRIPTION OF PROCEDURE:  The patient was examined and consented in the preoperative holding area where the aforementioned topical anesthesia was applied to the left eye and then brought back to the Operating Room where the left eye was prepped and draped in the usual sterile ophthalmic fashion and a lid speculum was placed. A paracentesis was created with the side port blade and the anterior chamber was filled with viscoelastic. A near clear corneal incision was performed with the steel keratome. A continuous curvilinear capsulorrhexis was performed with a cystotome followed by the capsulorrhexis forceps. Hydrodissection and hydrodelineation were carried out with BSS on a blunt cannula. The lens was removed in a stop and chop  technique and the remaining cortical material was removed with the irrigation-aspiration handpiece. The capsular bag was inflated with viscoelastic and the Technis [id number] lens was placed in the capsular bag without complication. The remaining viscoelastic was removed from the eye with the irrigation-aspiration handpiece. The wounds were hydrated. The anterior chamber was flushed with BSS and the eye was inflated to physiologic pressure. 0.48ml Vigamox was placed in the anterior chamber. The wounds were found to be water tight. The eye was dressed with Combigan. The patient was given protective glasses to wear throughout the day and a shield with which to sleep tonight. The patient was also given drops with which to begin a drop  regimen today and will follow-up with me in one day. Implant Name Type Inv. Item Serial No. Manufacturer Lot No. LRB No. Used Action  LENS IOL TECNIS EYHANCE 21.5 - [id number] Intraocular Lens LENS IOL TECNIS EYHANCE 21.5 [id number] [employer]  Left 1 Implanted    Procedure(s): CATARACT EXTRACTION PHACO AND INTRAOCULAR LENS PLACEMENT (IOC) LEFT DIABETIC 5.54 00:43.3 (Left)  Electronically signed: Galen Manila 04/23/2023 8:16 AM

## 2023-04-23 NOTE — Anesthesia Postprocedure Evaluation (Signed)
Anesthesia Post Note  Patient: Emma Patrick  Procedure(s) Performed: CATARACT EXTRACTION PHACO AND INTRAOCULAR LENS PLACEMENT (IOC) LEFT DIABETIC 5.54 00:43.3 (Left)  Patient location during evaluation: PACU Anesthesia Type: MAC Level of consciousness: awake and alert, oriented and patient cooperative Pain management: pain level controlled Vital Signs Assessment: post-procedure vital signs reviewed and stable Respiratory status: spontaneous breathing, nonlabored ventilation and respiratory function stable Cardiovascular status: blood pressure returned to baseline and stable Postop Assessment: adequate PO intake Anesthetic complications: no   No notable events documented.   Last Vitals:  Vitals:   04/23/23 0820 04/23/23 0821  BP:  (!) 147/64  Pulse: 74 72  Resp: 18 13  Temp:    SpO2: 97% 95%    Last Pain:  Vitals:   04/23/23 0823  TempSrc:   PainSc: 0-No pain                 Reed Breech

## 2023-04-23 NOTE — Transfer of Care (Signed)
Immediate Anesthesia Transfer of Care Note  Patient: Emma Patrick  Procedure(s) Performed: CATARACT EXTRACTION PHACO AND INTRAOCULAR LENS PLACEMENT (IOC) LEFT DIABETIC 5.54 00:43.3 (Left)  Patient Location: PACU  Anesthesia Type: MAC  Level of Consciousness: awake, alert  and patient cooperative  Airway and Oxygen Therapy: Patient Spontanous Breathing and Patient connected to supplemental oxygen  Post-op Assessment: Post-op Vital signs reviewed, Patient's Cardiovascular Status Stable, Respiratory Function Stable, Patent Airway and No signs of Nausea or vomiting  Post-op Vital Signs: Reviewed and stable  Complications: No notable events documented.

## 2023-04-23 NOTE — H&P (Signed)
[facility]   Primary Care Physician:  Danella Penton, MD Ophthalmologist: Dr.POriflio  Pre-Procedure History & Physical: HPI:  Emma Patrick is a 82 y.o. female here for cataract surgery.   Past Medical History:  Diagnosis Date   Anginal pain (HCC)    Family history of adverse reaction to anesthesia    Grade II diastolic dysfunction    Hypertension    Mild mitral regurgitation by prior echocardiogram    Psoriasis 2005   Wears dentures    full upper and lower    Past Surgical History:  Procedure Laterality Date   ABDOMINAL HYSTERECTOMY     CATARACT EXTRACTION W/PHACO Right 04/09/2023   Procedure: CATARACT EXTRACTION PHACO AND INTRAOCULAR LENS PLACEMENT (IOC) RIGHT DIABETIC 5.64 00:39.0;  Surgeon: Galen Manila, MD;  Location: [facility];  Service: Ophthalmology;  Laterality: Right;   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 Authorizing Provider  amLODipine (NORVASC) 2.5 MG tablet Take 5 mg by mouth daily. 07/14/20  Yes [provider]  glipiZIDE (GLUCOTROL) 10 MG tablet Take 20 mg by mouth daily before breakfast.   Yes [provider]  insulin glargine (LANTUS) 100 UNIT/ML injection Inject 40 Units into the skin 2 (two) times daily.   Yes [provider]  levothyroxine (SYNTHROID) 100 MCG tablet Take 100 mcg by mouth daily. 07/15/20  Yes [provider]  metFORMIN (GLUCOPHAGE) 500 MG tablet Take 1,000 mg by mouth 2 (two)  times daily with a meal.   Yes [provider]  rosuvastatin (CRESTOR) 10 MG tablet Take 10 mg by mouth daily.   Yes [provider]  triamterene-hydrochlorothiazide (DYAZIDE) 37.5-25 MG capsule Take 1 capsule by mouth daily. 07/14/20  Yes [provider]    Allergies as of 03/25/2023 - Review Complete 08/31/2020  Allergen Reaction Noted   Amoxicillin Diarrhea, Itching, and Swelling 03/23/2014   Penicillins Swelling 08/12/2020    Family History  Problem Relation Age of Onset   Breast cancer Sister 45   Dementia Mother    Heart attack Mother     Social History   Socioeconomic History   Marital status: Married    Spouse name: Kevin Fenton    Number of children: 3   Years of education: Not on file   Highest education level: Not on file  Occupational History   Occupation: retired     Comment: production superv: [employer]  Tobacco Use   Smoking status: Former    Current packs/day: 0.00    Types: Cigarettes    Quit date: 1972    Years since quitting: 52.5   Smokeless tobacco: Never  Vaping Use   Vaping status: Never Used  Substance and Sexual Activity   Alcohol use: Never   Drug use: Never   Sexual activity: Not on file  Other Topics Concern   Not on file  Social History Narrative   Lives at home with spouse : 1 daughter, 2 sons    Social Determinants of Health   Financial Resource Strain: Low  Risk  (12/19/2022)   Received from [facility], [facility]   Overall Financial Resource Strain (CARDIA)    Difficulty of Paying Living Expenses: Not hard at all  Food Insecurity: No Food Insecurity (12/19/2022)   Received from [facility], [facility]   Hunger Vital Sign    Worried About Running Out of Food in the Last Year: Never true    Ran Out of Food in the Last Year: Never true  Transportation Needs: No Transportation Needs (12/19/2022)   Received from [facility], [facility]   [facility] - Transportation    In the past 12 months, has lack of transportation kept you from medical appointments or from getting medications?: No    Lack of Transportation (Non-Medical): No  Physical Activity: Not on file  Stress: Not on file  Social Connections: Not on file  Intimate Partner Violence: Not on file    Review of Systems: See HPI, otherwise negative ROS  Physical Exam: BP (!) 172/76   Pulse 72   Temp (!) 97.2 F (36.2 C) (Temporal)   Resp 12   Wt 78.5 kg   SpO2 93%   BMI 29.70 kg/m  General:   Alert, cooperative in NAD Head:  Normocephalic and atraumatic. Respiratory:  Normal work of breathing. Cardiovascular:  RRR  Impression/Plan: Emma Patrick is here for cataract surgery.  Risks, benefits, limitations, and alternatives regarding cataract surgery have been reviewed with the patient.  Questions have been answered.  All parties agreeable.   Galen Manila, MD  04/23/2023, 7:54 AM

## 2023-04-24 ENCOUNTER — Encounter: Payer: Self-pay | Admitting: Ophthalmology

## 2023-06-14 DIAGNOSIS — Z794 Long term (current) use of insulin: Secondary | ICD-10-CM | POA: Diagnosis not present

## 2023-06-14 DIAGNOSIS — E1165 Type 2 diabetes mellitus with hyperglycemia: Secondary | ICD-10-CM | POA: Diagnosis not present

## 2023-06-21 DIAGNOSIS — H524 Presbyopia: Secondary | ICD-10-CM | POA: Diagnosis not present

## 2023-06-21 DIAGNOSIS — E119 Type 2 diabetes mellitus without complications: Secondary | ICD-10-CM | POA: Diagnosis not present

## 2023-06-21 DIAGNOSIS — Z794 Long term (current) use of insulin: Secondary | ICD-10-CM | POA: Diagnosis not present

## 2023-08-06 DIAGNOSIS — Z96651 Presence of right artificial knee joint: Secondary | ICD-10-CM | POA: Diagnosis not present

## 2023-08-06 DIAGNOSIS — E1151 Type 2 diabetes mellitus with diabetic peripheral angiopathy without gangrene: Secondary | ICD-10-CM | POA: Diagnosis not present

## 2023-08-06 DIAGNOSIS — Z96652 Presence of left artificial knee joint: Secondary | ICD-10-CM | POA: Diagnosis not present

## 2023-08-06 DIAGNOSIS — Z96653 Presence of artificial knee joint, bilateral: Secondary | ICD-10-CM | POA: Diagnosis not present

## 2023-09-02 DIAGNOSIS — E1165 Type 2 diabetes mellitus with hyperglycemia: Secondary | ICD-10-CM | POA: Diagnosis not present

## 2023-09-02 DIAGNOSIS — Z794 Long term (current) use of insulin: Secondary | ICD-10-CM | POA: Diagnosis not present

## 2023-09-12 DIAGNOSIS — Z794 Long term (current) use of insulin: Secondary | ICD-10-CM | POA: Diagnosis not present

## 2023-09-12 DIAGNOSIS — E119 Type 2 diabetes mellitus without complications: Secondary | ICD-10-CM | POA: Diagnosis not present

## 2023-09-17 DIAGNOSIS — Z8249 Family history of ischemic heart disease and other diseases of the circulatory system: Secondary | ICD-10-CM | POA: Diagnosis not present

## 2023-09-17 DIAGNOSIS — E782 Mixed hyperlipidemia: Secondary | ICD-10-CM | POA: Diagnosis not present

## 2023-09-17 DIAGNOSIS — Z955 Presence of coronary angioplasty implant and graft: Secondary | ICD-10-CM | POA: Diagnosis not present

## 2023-09-17 DIAGNOSIS — E1151 Type 2 diabetes mellitus with diabetic peripheral angiopathy without gangrene: Secondary | ICD-10-CM | POA: Diagnosis not present

## 2023-09-17 DIAGNOSIS — I10 Essential (primary) hypertension: Secondary | ICD-10-CM | POA: Diagnosis not present

## 2023-09-17 DIAGNOSIS — I2510 Atherosclerotic heart disease of native coronary artery without angina pectoris: Secondary | ICD-10-CM | POA: Diagnosis not present

## 2023-11-08 DIAGNOSIS — H35431 Paving stone degeneration of retina, right eye: Secondary | ICD-10-CM | POA: Diagnosis not present

## 2023-11-08 DIAGNOSIS — H43813 Vitreous degeneration, bilateral: Secondary | ICD-10-CM | POA: Diagnosis not present

## 2023-11-08 DIAGNOSIS — Z961 Presence of intraocular lens: Secondary | ICD-10-CM | POA: Diagnosis not present

## 2023-11-08 DIAGNOSIS — E119 Type 2 diabetes mellitus without complications: Secondary | ICD-10-CM | POA: Diagnosis not present

## 2024-01-06 DIAGNOSIS — E1165 Type 2 diabetes mellitus with hyperglycemia: Secondary | ICD-10-CM | POA: Diagnosis not present

## 2024-01-06 DIAGNOSIS — E1151 Type 2 diabetes mellitus with diabetic peripheral angiopathy without gangrene: Secondary | ICD-10-CM | POA: Diagnosis not present

## 2024-01-06 DIAGNOSIS — E538 Deficiency of other specified B group vitamins: Secondary | ICD-10-CM | POA: Diagnosis not present

## 2024-01-13 DIAGNOSIS — Z1231 Encounter for screening mammogram for malignant neoplasm of breast: Secondary | ICD-10-CM | POA: Diagnosis not present

## 2024-01-13 DIAGNOSIS — Z1331 Encounter for screening for depression: Secondary | ICD-10-CM | POA: Diagnosis not present

## 2024-01-13 DIAGNOSIS — E785 Hyperlipidemia, unspecified: Secondary | ICD-10-CM | POA: Diagnosis not present

## 2024-01-13 DIAGNOSIS — Z0000 Encounter for general adult medical examination without abnormal findings: Secondary | ICD-10-CM | POA: Diagnosis not present

## 2024-01-13 DIAGNOSIS — E119 Type 2 diabetes mellitus without complications: Secondary | ICD-10-CM | POA: Diagnosis not present

## 2024-05-07 DIAGNOSIS — E1151 Type 2 diabetes mellitus with diabetic peripheral angiopathy without gangrene: Secondary | ICD-10-CM | POA: Diagnosis not present

## 2024-05-07 DIAGNOSIS — E782 Mixed hyperlipidemia: Secondary | ICD-10-CM | POA: Diagnosis not present

## 2024-05-14 DIAGNOSIS — Z794 Long term (current) use of insulin: Secondary | ICD-10-CM | POA: Diagnosis not present

## 2024-05-14 DIAGNOSIS — E538 Deficiency of other specified B group vitamins: Secondary | ICD-10-CM | POA: Diagnosis not present

## 2024-05-14 DIAGNOSIS — E119 Type 2 diabetes mellitus without complications: Secondary | ICD-10-CM | POA: Diagnosis not present

## 2024-05-29 DIAGNOSIS — L814 Other melanin hyperpigmentation: Secondary | ICD-10-CM | POA: Diagnosis not present

## 2024-05-29 DIAGNOSIS — L821 Other seborrheic keratosis: Secondary | ICD-10-CM | POA: Diagnosis not present

## 2024-06-17 DIAGNOSIS — Z8249 Family history of ischemic heart disease and other diseases of the circulatory system: Secondary | ICD-10-CM | POA: Diagnosis not present

## 2024-06-17 DIAGNOSIS — Z955 Presence of coronary angioplasty implant and graft: Secondary | ICD-10-CM | POA: Diagnosis not present

## 2024-06-17 DIAGNOSIS — I2510 Atherosclerotic heart disease of native coronary artery without angina pectoris: Secondary | ICD-10-CM | POA: Diagnosis not present

## 2024-06-17 DIAGNOSIS — E782 Mixed hyperlipidemia: Secondary | ICD-10-CM | POA: Diagnosis not present

## 2024-06-17 DIAGNOSIS — I10 Essential (primary) hypertension: Secondary | ICD-10-CM | POA: Diagnosis not present

## 2024-08-06 DIAGNOSIS — Z96651 Presence of right artificial knee joint: Secondary | ICD-10-CM | POA: Diagnosis not present

## 2024-08-06 DIAGNOSIS — Z96652 Presence of left artificial knee joint: Secondary | ICD-10-CM | POA: Diagnosis not present

## 2024-08-06 DIAGNOSIS — E1151 Type 2 diabetes mellitus with diabetic peripheral angiopathy without gangrene: Secondary | ICD-10-CM | POA: Diagnosis not present
